# Patient Record
Sex: Female | Born: 1960 | Race: White | Hispanic: No | Marital: Single | Smoking: Former smoker
Health system: Southern US, Community
[De-identification: ages and names within clinical notes are randomized; demographics above are authoritative.]

## PROBLEM LIST (undated history)

## (undated) DIAGNOSIS — C50919 Malignant neoplasm of unspecified site of unspecified female breast: Secondary | ICD-10-CM

## (undated) DIAGNOSIS — C801 Malignant (primary) neoplasm, unspecified: Secondary | ICD-10-CM

## (undated) DIAGNOSIS — K219 Gastro-esophageal reflux disease without esophagitis: Secondary | ICD-10-CM

## (undated) DIAGNOSIS — M1990 Unspecified osteoarthritis, unspecified site: Secondary | ICD-10-CM

## (undated) DIAGNOSIS — K649 Unspecified hemorrhoids: Secondary | ICD-10-CM

## (undated) DIAGNOSIS — K5792 Diverticulitis of intestine, part unspecified, without perforation or abscess without bleeding: Secondary | ICD-10-CM

## (undated) DIAGNOSIS — Z9889 Other specified postprocedural states: Secondary | ICD-10-CM

## (undated) HISTORY — PX: BREAST LUMPECTOMY: SHX2

## (undated) HISTORY — DX: Malignant (primary) neoplasm, unspecified: C80.1

## (undated) HISTORY — PX: HERNIA REPAIR: SHX51

## (undated) HISTORY — DX: Other specified postprocedural states: Z98.890

## (undated) HISTORY — PX: CYST EXCISION: SHX5701

## (undated) HISTORY — PX: CHOLECYSTECTOMY: SHX55

## (undated) HISTORY — DX: Diverticulitis of intestine, part unspecified, without perforation or abscess without bleeding: K57.92

## (undated) HISTORY — DX: Unspecified osteoarthritis, unspecified site: M19.90

## (undated) HISTORY — DX: Gastro-esophageal reflux disease without esophagitis: K21.9

## (undated) HISTORY — DX: Malignant neoplasm of unspecified site of unspecified female breast: C50.919

## (undated) NOTE — Progress Notes (Signed)
 Formatting of this note is different from the original. Subjective  Patient ID: Haley Santana is a 13 y.o. female presenting to the Urgent Care with a chief complaint of UTI.  UTI  Patient with lower pelvic pressure and urgency to urinate. Symptoms have been ongoing but she could not sleep last night due to pain. No blood in urine. No flank pain. She feels like something is stretching her uterus. No fever, nausea, vomiting. No dysuria. She has not tried anything  Review of Systems  Objective  BP 126/78   Pulse 92   Temp 36.1 C (96.9 F) (Temporal)   Resp 16   Ht 1.638 m (5' 4.5)   Wt 77.1 kg (170 lb)   SpO2 97%   BMI 28.73 kg/m  No results found. No LMP recorded.  Physical Exam Vitals and nursing note reviewed.  Constitutional:      Appearance: Normal appearance. She is not ill-appearing.  HENT:     Head: Normocephalic and atraumatic.  Cardiovascular:     Rate and Rhythm: Normal rate and regular rhythm.  Pulmonary:     Effort: Pulmonary effort is normal. No respiratory distress.     Breath sounds: Normal breath sounds.  Abdominal:     Tenderness: There is no abdominal tenderness. There is no right CVA tenderness, left CVA tenderness, guarding or rebound.  Musculoskeletal:        General: Normal range of motion.  Skin:    General: Skin is warm and dry.  Neurological:     General: No focal deficit present.     Mental Status: She is alert.     Gait: Gait normal.  Psychiatric:        Mood and Affect: Mood normal.     In-House Lab Results:   Results for orders placed or performed in visit on 12/29/23  POCT Urinalysis   Collection Time: 12/29/23  8:26 AM  Result Value Ref Range   Color, UA Light Yellow    Clarity, UA Clear    Leukocytes, UA Negative    Nitrite, UA Negative    Urobilinogen, UA 0.2    Protein, UA Negative    pH, UA 6.0    Blood UA Negative    Spec Grav, UA 1.000    Ketones, UA Negative    Bilirubin, UA Negative    Glucose, UA Negative      Assessment & Plan   Assessment & Plan Urinary frequency  Orders:   POCT Urinalysis   phenazopyridine (Pyridium) 200 MG tablet; Take 1 tablet by mouth 3 times a day with meals for 2 days.   UA is negative for infection. No CVA tenderness or abdominal pain on exa,. Could be interstitial cystitic of function problem. Start pyridium and follow up with PCP for ultrasound  There are no Patient Instructions on file for this visit.  In-House Imaging Reads:    Procedure Documentation: Procedures   Alan Jenkins Rave, PA-C   Electronically signed by Alan Jenkins Rave, PA-C at 12/29/2023  8:51 AM EDT

---

## 1982-06-13 DIAGNOSIS — Z9889 Other specified postprocedural states: Secondary | ICD-10-CM

## 1982-06-13 HISTORY — DX: Other specified postprocedural states: Z98.890

## 2016-06-13 DIAGNOSIS — Z923 Personal history of irradiation: Secondary | ICD-10-CM

## 2016-06-13 HISTORY — PX: BREAST LUMPECTOMY: SHX2

## 2016-06-13 HISTORY — DX: Personal history of irradiation: Z92.3

## 2017-02-20 DIAGNOSIS — C50919 Malignant neoplasm of unspecified site of unspecified female breast: Secondary | ICD-10-CM

## 2017-02-20 HISTORY — DX: Malignant neoplasm of unspecified site of unspecified female breast: C50.919

## 2018-06-25 DIAGNOSIS — C50919 Malignant neoplasm of unspecified site of unspecified female breast: Secondary | ICD-10-CM | POA: Diagnosis not present

## 2018-06-25 DIAGNOSIS — Z6829 Body mass index (BMI) 29.0-29.9, adult: Secondary | ICD-10-CM | POA: Diagnosis not present

## 2018-06-29 DIAGNOSIS — Z13220 Encounter for screening for lipoid disorders: Secondary | ICD-10-CM | POA: Diagnosis not present

## 2018-06-29 DIAGNOSIS — Z1329 Encounter for screening for other suspected endocrine disorder: Secondary | ICD-10-CM | POA: Diagnosis not present

## 2018-06-29 DIAGNOSIS — Z114 Encounter for screening for human immunodeficiency virus [HIV]: Secondary | ICD-10-CM | POA: Diagnosis not present

## 2018-06-29 DIAGNOSIS — Z0000 Encounter for general adult medical examination without abnormal findings: Secondary | ICD-10-CM | POA: Diagnosis not present

## 2018-07-03 ENCOUNTER — Other Ambulatory Visit: Payer: Self-pay | Admitting: Family Medicine

## 2018-07-03 DIAGNOSIS — Z23 Encounter for immunization: Secondary | ICD-10-CM | POA: Diagnosis not present

## 2018-07-03 DIAGNOSIS — Z1151 Encounter for screening for human papillomavirus (HPV): Secondary | ICD-10-CM | POA: Diagnosis not present

## 2018-07-03 DIAGNOSIS — Z118 Encounter for screening for other infectious and parasitic diseases: Secondary | ICD-10-CM | POA: Diagnosis not present

## 2018-07-03 DIAGNOSIS — Z0000 Encounter for general adult medical examination without abnormal findings: Secondary | ICD-10-CM | POA: Diagnosis not present

## 2018-07-03 DIAGNOSIS — N6320 Unspecified lump in the left breast, unspecified quadrant: Secondary | ICD-10-CM | POA: Diagnosis not present

## 2018-07-03 DIAGNOSIS — Z6829 Body mass index (BMI) 29.0-29.9, adult: Secondary | ICD-10-CM | POA: Diagnosis not present

## 2018-07-03 DIAGNOSIS — N760 Acute vaginitis: Secondary | ICD-10-CM | POA: Diagnosis not present

## 2018-07-03 DIAGNOSIS — Z1211 Encounter for screening for malignant neoplasm of colon: Secondary | ICD-10-CM | POA: Diagnosis not present

## 2018-07-03 DIAGNOSIS — Z113 Encounter for screening for infections with a predominantly sexual mode of transmission: Secondary | ICD-10-CM | POA: Diagnosis not present

## 2018-07-03 DIAGNOSIS — Z01419 Encounter for gynecological examination (general) (routine) without abnormal findings: Secondary | ICD-10-CM | POA: Diagnosis not present

## 2018-07-05 ENCOUNTER — Other Ambulatory Visit: Payer: Self-pay | Admitting: Family Medicine

## 2018-07-05 DIAGNOSIS — N6320 Unspecified lump in the left breast, unspecified quadrant: Secondary | ICD-10-CM

## 2018-07-24 ENCOUNTER — Ambulatory Visit: Payer: BLUE CROSS/BLUE SHIELD | Admitting: Orthopaedic Surgery

## 2018-07-24 ENCOUNTER — Encounter: Payer: Self-pay | Admitting: Orthopaedic Surgery

## 2018-07-24 ENCOUNTER — Ambulatory Visit (INDEPENDENT_AMBULATORY_CARE_PROVIDER_SITE_OTHER): Payer: BLUE CROSS/BLUE SHIELD

## 2018-07-24 VITALS — BP 123/81 | HR 87 | Ht 64.0 in | Wt 179.0 lb

## 2018-07-24 DIAGNOSIS — M25571 Pain in right ankle and joints of right foot: Secondary | ICD-10-CM | POA: Diagnosis not present

## 2018-07-24 NOTE — Progress Notes (Signed)
Subjective:    Patient ID: Haley Santana, female    DOB: March 01, 1961, 58 y.o.   MRN: [id number]  HPI She had an injury to the right foot about five years ago. It has been bothering her on and off since then. She has had some swelling in the past but none now. She has no redness.  She has no new trauma.  She has pain over the forefoot more in the metatarsal head area. She has no change in shoe wear. She has tried Advil which helps, Aspercreme which helped. She has tried stretching. She has no numbness.   Review of Systems  Constitutional: Positive for activity change.  Musculoskeletal: Positive for arthralgias and gait problem.  All other systems reviewed and are negative.  For Review of Systems, all other systems reviewed and are negative.  The following is a summary of the past history medically, past history surgically, known current medicines, social history and family history.  This information is gathered electronically by the computer from prior information and documentation.  I review this each visit and have found including this information at this point in the chart is beneficial and informative.   Past Medical History:  Diagnosis Date  . Arthritis   . Cancer ([place])   . GERD (gastroesophageal reflux disease)     Past Surgical History:  Procedure Laterality Date  . BREAST LUMPECTOMY    . CHOLECYSTECTOMY    . CYST EXCISION    . HERNIA REPAIR      Current Outpatient Medications on File Prior to Visit  Medication Sig Dispense Refill  . cetirizine (ZYRTEC) 10 MG tablet Take 10 mg by mouth daily.    . tamoxifen (NOLVADEX) 20 MG tablet      No current facility-administered medications on file prior to visit.     Social History   Socioeconomic History  . Marital status: Single    Spouse name: Not on file  . Number of children: Not on file  . Years of education: Not on file  . Highest education level: Not on file  Occupational History  . Not on file  Social Needs    . Financial resource strain: Not on file  . Food insecurity:    Worry: Not on file    Inability: Not on file  . Transportation needs:    Medical: Not on file    Non-medical: Not on file  Tobacco Use  . Smoking status: Never Smoker  . Smokeless tobacco: Never Used  Substance and Sexual Activity  . Alcohol use: Yes  . Drug use: Never  . Sexual activity: Not on file  Lifestyle  . Physical activity:    Days per week: Not on file    Minutes per session: Not on file  . Stress: Not on file  Relationships  . Social connections:    Talks on phone: Not on file    Gets together: Not on file    Attends religious service: Not on file    Active member of club or organization: Not on file    Attends meetings of clubs or organizations: Not on file    Relationship status: Not on file  . Intimate partner violence:    Fear of current or ex partner: Not on file    Emotionally abused: Not on file    Physically abused: Not on file    Forced sexual activity: Not on file  Other Topics Concern  . Not on file  Social History Narrative  .  Not on file    Family History  Problem Relation Age of Onset  . Cancer Mother   . Cancer Father   . Cancer Sister   . Diabetes Brother   . Cancer Brother     BP 123/81   Pulse 87   Ht 5\' 4"  (1.626 m)   Wt 179 lb (81.2 kg)   BMI 30.73 kg/m   Body mass index is 30.73 kg/m.     Objective:   Physical Exam Constitutional:      Appearance: She is well-developed.  HENT:     Head: Normocephalic and atraumatic.  Eyes:     Conjunctiva/sclera: Conjunctivae normal.     Pupils: Pupils are equal, round, and reactive to light.  Neck:     Musculoskeletal: Normal range of motion and neck supple.  Cardiovascular:     Rate and Rhythm: Normal rate and regular rhythm.  Pulmonary:     Effort: Pulmonary effort is normal.  Abdominal:     Palpations: Abdomen is soft.  Musculoskeletal:       Feet:  Skin:    General: Skin is warm and dry.  Neurological:      Mental Status: She is alert and oriented to person, place, and time.     Cranial Nerves: No cranial nerve deficit.     Motor: No abnormal muscle tone.     Coordination: Coordination normal.     Deep Tendon Reflexes: Reflexes are normal and symmetric. Reflexes normal.  Psychiatric:        Behavior: Behavior normal.        Thought Content: Thought content normal.        Judgment: Judgment normal.      X-rays were done of the right foot, reported separately.  Negative.     Assessment & Plan:   Encounter Diagnosis  Name Primary?  . Pain in joint involving right ankle and foot Yes   I have recommended Advil as needed.  I have recommended stretching of the toes bid and exercises.  Return in one month.  Call if any problem.  Precautions discussed.   Electronically Signed Sanjuana Kava, MD 2/11/202011:05 AM

## 2018-08-03 ENCOUNTER — Encounter (HOSPITAL_COMMUNITY): Payer: Self-pay | Admitting: Hematology

## 2018-08-03 ENCOUNTER — Inpatient Hospital Stay (HOSPITAL_COMMUNITY): Payer: BLUE CROSS/BLUE SHIELD | Attending: Hematology | Admitting: Hematology

## 2018-08-03 VITALS — BP 138/74 | HR 93 | Temp 98.1°F | Resp 18 | Wt 176.0 lb

## 2018-08-03 DIAGNOSIS — Z79810 Long term (current) use of selective estrogen receptor modulators (SERMs): Secondary | ICD-10-CM | POA: Diagnosis not present

## 2018-08-03 DIAGNOSIS — C50412 Malignant neoplasm of upper-outer quadrant of left female breast: Secondary | ICD-10-CM | POA: Diagnosis not present

## 2018-08-03 DIAGNOSIS — Z170 Estrogen receptor positive status [ER+]: Secondary | ICD-10-CM | POA: Diagnosis not present

## 2018-08-03 DIAGNOSIS — Z13820 Encounter for screening for osteoporosis: Secondary | ICD-10-CM

## 2018-08-03 DIAGNOSIS — Z923 Personal history of irradiation: Secondary | ICD-10-CM

## 2018-08-03 NOTE — Assessment & Plan Note (Addendum)
1.  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 - Partial breast radiation therapy dose of 34 Gray at 3.4 Gy per fraction given twice daily 6 hours apart in 10 fractions completed on 03/24/2017 at [facility]. - Tamoxifen started in October 2018.  She has very minor hot flashes at nighttime which have improved since the beginning. - Myriad myRisk-heterozygosity for MUTYH with a slightly increased risk for colon cancer. -Bilateral mammogram in July 2019 done at [facility] reportedly negative. -Colonoscopy in 2019 was reportedly negative. -We will obtain mammogram reports.  We will schedule for mammogram end of July.  I have also recommended doing a bone density test. -Physical examination today did not reveal any palpable masses.  Left breast lumpectomy scar in the periareolar region is stable.  There is mild lymphedema in the left breast.  Left axillary node dissection scar is also stable.  No palpable adenopathy. -We have counseled her to exercise on a regular basis to decrease her chances of cancer coming back. - I will see her back after the mammogram and bone density test.

## 2018-08-03 NOTE — Progress Notes (Signed)
[facility] NOTE  Patient Care Team: Fanny Bien, MD as PCP - General (Family Medicine)  CHIEF COMPLAINTS/PURPOSE OF CONSULTATION: Left breast cancer in 2018 establishing care from [place]:  Haley Santana 58 y.o. female is here because of a diagnosis of left breast cancer in 2018. In August of 2018 she had her routine mammogram where they found a left breast mass. They brought her in for a ultrasound the very next day. That next week on 01/25/2017 she had a biopsy of the mass. She was diagnosed with invasive ductal carcinoma of the left breast, ER/PR+ HER2-. Surgery was set up and done on 02/02/2017. They waited approximately 4 weeks and did 10 days of radiation. Tamoxifen was started 3 weeks after radiation was completed. She was having severe hot flashes when she first started the tamoxifen and they have improved over time. She still has them however they are only at night time. Denies any nausea, vomiting, or diarrhea. Denies any new pains. Had not noticed any recent bleeding such as epistaxis, hematuria or hematochezia. Denies recent chest pain on exertion, shortness of breath on minimal exertion, pre-syncopal episodes, or palpitations. Denies any numbness or tingling in hands or feet. Denies any recent fevers, infections, or recent hospitalizations. Patient reports appetite at 100% and energy level at 75%.  She reports she has never had a Dexa scan. She had a Colonoscopy at the beginning of 2019 and reports that it was completely normal. She had her last Mammogram bilateral diagnostic in the middle of July 2019. She reports she does not take calcium of Vitamin D however she will start taking these.   She has a large family history of cancer. Her mother had lung cancer and was a heavy smoker. Her father had colon cancer. She had two brothers with skin cancer. One sister with tongue cancer. Maternal aunt and maternal cousin with breast cancer.  One maternal aunt with Ovarian cancer.   She moved from [place] in July 2019 due to family living in [place]. She is retired at this time but plans on finding something part time to help her financially. 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 She lives alone with her 2 cats and performs all her own ADLs and activities. She drives and handles all her own finances.   I reviewed her records extensively and collaborated the history with the patient.  SUMMARY OF ONCOLOGIC HISTORY:   Breast cancer of upper-outer quadrant of left female breast ([place])   08/03/2018 Initial Diagnosis    Breast cancer of upper-outer quadrant of left female breast ([place])    08/03/2018 Cancer Staging    Staging form: Breast, AJCC 8th Edition - Clinical stage from 08/03/2018: Stage IA (cT1a, cN0, cM0, G1, ER+, PR+, HER2-) - Signed by Derek Jack, MD on 08/03/2018     In terms of breast cancer risk profile:  She menarched at early age of 49 and went to menopause at age 62 She had 4 pregnancies and two of those she had miscarriages.  She did received birth control pills for approximately 2 years. She was never exposed to fertility medications or hormone replacement therapy.  She has a positive family history of Breast/GYN/GI cancer  MEDICAL HISTORY:  Past Medical History:  Diagnosis Date  . Arthritis   . Breast cancer ([place]) 02/20/2017   left  . Cancer ([place])   . Diverticulitis   .  GERD (gastroesophageal reflux disease)   . H/O benign breast biopsy 1984   right    SURGICAL HISTORY: Past Surgical History:  Procedure Laterality Date  . BREAST LUMPECTOMY  02/02/2017  . CHOLECYSTECTOMY  10/2016  . CYST EXCISION    . HERNIA REPAIR      SOCIAL HISTORY: Social History   Socioeconomic History  . Marital status: Single    Spouse name: Not on file  . Number of children: Not on file  . Years of education: Not on file  . Highest  education level: Not on file  Occupational History  . Not on file  Social Needs  . Financial resource strain: Not on file  . Food insecurity:    Worry: Not on file    Inability: Not on file  . Transportation needs:    Medical: Not on file    Non-medical: Not on file  Tobacco Use  . Smoking status: Never Smoker  . Smokeless tobacco: Never Used  Substance and Sexual Activity  . Alcohol use: Yes  . Drug use: Never  . Sexual activity: Not on file  Lifestyle  . Physical activity:    Days per week: Not on file    Minutes per session: Not on file  . Stress: Not on file  Relationships  . Social connections:    Talks on phone: Not on file    Gets together: Not on file    Attends religious service: Not on file    Active member of club or organization: Not on file    Attends meetings of clubs or organizations: Not on file    Relationship status: Not on file  . Intimate partner violence:    Fear of current or ex partner: Not on file    Emotionally abused: Not on file    Physically abused: Not on file    Forced sexual activity: Not on file  Other Topics Concern  . Not on file  Social History Narrative  . Not on file    FAMILY HISTORY: Family History  Problem Relation Age of Onset  . Cancer Mother   . Cancer Father   . Cancer Sister   . Diabetes Brother   . Cancer Brother     ALLERGIES:  is allergic to dust mite extract; latex; and other.  MEDICATIONS:  Current Outpatient Medications  Medication Sig Dispense Refill  . cetirizine (ZYRTEC) 10 MG tablet Take 10 mg by mouth daily.    . Multiple Vitamins-Minerals (MULTIVITAMIN WITH MINERALS) tablet Take 1 tablet by mouth daily.    . tamoxifen (NOLVADEX) 20 MG tablet      No current facility-administered medications for this visit.     REVIEW OF SYSTEMS:   Constitutional: Denies fevers, chills or abnormal night sweats Eyes: Denies blurriness of vision, double vision or watery eyes Ears, nose, mouth, throat, and face:  Denies mucositis or sore throat Respiratory: Denies cough, dyspnea or wheezes Cardiovascular: Denies palpitation, chest discomfort or lower extremity swelling Gastrointestinal:  Denies nausea, heartburn or change in bowel habits Skin: Denies abnormal skin rashes Lymphatics: Denies new lymphadenopathy or easy bruising Neurological:Denies numbness, tingling or new weaknesses Behavioral/Psych: Mood is stable, no new changes  Breast: Denies any palpable lumps or discharge All other systems were reviewed with the patient and are negative.  PHYSICAL EXAMINATION: ECOG PERFORMANCE STATUS: 1 - Symptomatic but completely ambulatory  Vitals:   08/03/18 1307  BP: 138/74  Pulse: 93  Resp: 18  Temp: 98.1 F (36.7 C)  SpO2: 98%   Filed Weights   08/03/18 1307  Weight: 176 lb (79.8 kg)    GENERAL:alert, no distress and comfortable SKIN: skin color, texture, turgor are normal, no rashes or significant lesions EYES: normal, conjunctiva are pink and non-injected, sclera clear OROPHARYNX:no exudate, no erythema and lips, buccal mucosa, and tongue normal  NECK: supple, thyroid normal size, non-tender, without nodularity LYMPH:  no palpable lymphadenopathy in the cervical, axillary or inguinal LUNGS: clear to auscultation and percussion with normal breathing effort HEART: regular rate & rhythm and no murmurs and no lower extremity edema ABDOMEN:abdomen soft, non-tender and normal bowel sounds Musculoskeletal:no cyanosis of digits and no clubbing  PSYCH: alert & oriented x 3 with fluent speech NEURO: no focal motor/sensory deficits BREAST:No palpable nodules in breast. No palpable axillary or supraclavicular lymphadenopathy (exam performed in the presence of a chaperone)   LABORATORY DATA:  I have reviewed the data as listed  RADIOGRAPHIC STUDIES: I have personally reviewed the radiological reports and agreed with the findings in the report. I have reviewed Francene Finders, NP's note and agree  with the documentation.  I personally performed a face-to-face visit, made revisions and my assessment and plan is as follows.  ASSESSMENT AND PLAN:  Breast cancer of upper-outer quadrant of left female breast ([place]) 1.  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 - Partial breast radiation therapy dose of 34 Gray at 3.4 Gy per fraction given twice daily 6 hours apart in 10 fractions completed on 03/24/2017 at [facility]. - Tamoxifen started in October 2018.  She has very minor hot flashes at nighttime which have improved since the beginning. - Myriad myRisk-heterozygosity for MUTYH with a slightly increased risk for colon cancer. -Bilateral mammogram in July 2019 done at [facility] reportedly negative. -Colonoscopy in 2019 was reportedly negative. -We will obtain mammogram reports.  We will schedule for mammogram end of July.  I have also recommended doing a bone density test. -Physical examination today did not reveal any palpable masses.  Left breast lumpectomy scar in the periareolar region is stable.  There is mild lymphedema in the left breast.  Left axillary node dissection scar is also stable.  No palpable adenopathy. -We have counseled her to exercise on a regular basis to decrease her chances of cancer coming back. - I will see her back after the mammogram and bone density test.   All questions were answered. The patient knows to call the clinic with any problems, questions or concerns.    Derek Jack, MD 08/03/18

## 2018-08-03 NOTE — Patient Instructions (Signed)
[place] at [facility] Discharge Instructions  Please start taking calcium and Vit D daily. Follow up with [place] in 5 months after your dexa bone scan and mammogram    Thank you for choosing [place] at [facility] to provide your oncology and hematology care.  To afford each patient quality time with our provider, please arrive at least 15 minutes before your scheduled appointment time.   If you have a lab appointment with the [facility] please come in thru the  Main Entrance and check in at the main information desk  You need to re-schedule your appointment should you arrive 10 or more minutes late.  We strive to give you quality time with our providers, and arriving late affects you and other patients whose appointments are after yours.  Also, if you no show three or more times for appointments you may be dismissed from the clinic at the providers discretion.     Again, thank you for choosing [facility].  Our hope is that these requests will decrease the amount of time that you wait before being seen by our physicians.       _____________________________________________________________  Should you have questions after your visit to [facility], please contact our office at [phone] between the hours of 8:00 a.m. and 4:30 p.m.  Voicemails left after 4:00 p.m. will not be returned until the following business day.  For prescription refill requests, have your pharmacy contact our office and allow 72 hours.    Cancer Center Support Programs:   > Cancer Support Group  2nd Tuesday of the month 1pm-2pm, Journey Room

## 2018-08-07 DIAGNOSIS — C50919 Malignant neoplasm of unspecified site of unspecified female breast: Secondary | ICD-10-CM | POA: Diagnosis not present

## 2018-08-07 DIAGNOSIS — Z6830 Body mass index (BMI) 30.0-30.9, adult: Secondary | ICD-10-CM | POA: Diagnosis not present

## 2018-08-09 ENCOUNTER — Other Ambulatory Visit: Payer: Self-pay | Admitting: Family Medicine

## 2018-08-09 DIAGNOSIS — R52 Pain, unspecified: Secondary | ICD-10-CM

## 2018-08-10 DIAGNOSIS — Z23 Encounter for immunization: Secondary | ICD-10-CM | POA: Diagnosis not present

## 2018-08-16 ENCOUNTER — Ambulatory Visit
Admission: RE | Admit: 2018-08-16 | Discharge: 2018-08-16 | Disposition: A | Discharge status: Home / Self Care | Payer: BLUE CROSS/BLUE SHIELD | Source: Ambulatory Visit | Attending: Family Medicine | Admitting: Family Medicine

## 2018-08-16 DIAGNOSIS — R102 Pelvic and perineal pain: Secondary | ICD-10-CM | POA: Diagnosis not present

## 2018-08-16 DIAGNOSIS — R52 Pain, unspecified: Secondary | ICD-10-CM

## 2018-09-11 ENCOUNTER — Ambulatory Visit: Payer: BLUE CROSS/BLUE SHIELD | Admitting: Orthopaedic Surgery

## 2018-12-12 ENCOUNTER — Ambulatory Visit (HOSPITAL_COMMUNITY): Payer: Self-pay | Admitting: Nurse Practitioner

## 2019-01-01 ENCOUNTER — Other Ambulatory Visit (HOSPITAL_COMMUNITY): Payer: Self-pay | Admitting: Nurse Practitioner

## 2019-01-01 DIAGNOSIS — C50412 Malignant neoplasm of upper-outer quadrant of left female breast: Secondary | ICD-10-CM

## 2019-01-01 DIAGNOSIS — Z170 Estrogen receptor positive status [ER+]: Secondary | ICD-10-CM

## 2019-01-02 ENCOUNTER — Other Ambulatory Visit (HOSPITAL_COMMUNITY): Payer: Self-pay | Admitting: Family Medicine

## 2019-01-08 ENCOUNTER — Ambulatory Visit (HOSPITAL_COMMUNITY): Admission: RE | Admit: 2019-01-08 | Payer: BC Managed Care – PPO | Source: Ambulatory Visit

## 2019-01-08 ENCOUNTER — Other Ambulatory Visit: Payer: Self-pay

## 2019-01-08 ENCOUNTER — Ambulatory Visit (HOSPITAL_COMMUNITY)
Admission: RE | Admit: 2019-01-08 | Discharge: 2019-01-08 | Disposition: A | Discharge status: Home / Self Care | Payer: BC Managed Care – PPO | Source: Ambulatory Visit | Attending: Nurse Practitioner | Admitting: Nurse Practitioner

## 2019-01-08 ENCOUNTER — Inpatient Hospital Stay (HOSPITAL_COMMUNITY): Payer: BC Managed Care – PPO | Attending: Hematology

## 2019-01-08 DIAGNOSIS — Z13820 Encounter for screening for osteoporosis: Secondary | ICD-10-CM | POA: Insufficient documentation

## 2019-01-08 DIAGNOSIS — Z780 Asymptomatic menopausal state: Secondary | ICD-10-CM | POA: Diagnosis not present

## 2019-01-08 DIAGNOSIS — C50412 Malignant neoplasm of upper-outer quadrant of left female breast: Secondary | ICD-10-CM | POA: Diagnosis not present

## 2019-01-08 DIAGNOSIS — Z170 Estrogen receptor positive status [ER+]: Secondary | ICD-10-CM

## 2019-01-08 LAB — COMPREHENSIVE METABOLIC PANEL
ALT: 26 U/L (ref 0–44)
AST: 22 U/L (ref 15–41)
Albumin: 4 g/dL (ref 3.5–5.0)
Alkaline Phosphatase: 47 U/L (ref 38–126)
Anion gap: 7 (ref 5–15)
BUN: 18 mg/dL (ref 6–20)
CO2: 26 mmol/L (ref 22–32)
Calcium: 9.1 mg/dL (ref 8.9–10.3)
Chloride: 108 mmol/L (ref 98–111)
Creatinine, Ser: 0.88 mg/dL (ref 0.44–1.00)
GFR calc Af Amer: >60 mL/min (ref >60)
GFR calc non Af Amer: >60 mL/min (ref >60)
Glucose, Bld: 88 mg/dL (ref 70–99)
Potassium: 4.4 mmol/L (ref 3.5–5.1)
Sodium: 141 mmol/L (ref 135–145)
Total Bilirubin: 0.5 mg/dL (ref 0.3–1.2)
Total Protein: 7 g/dL (ref 6.5–8.1)

## 2019-01-08 LAB — CBC WITH DIFFERENTIAL/PLATELET
Abs Immature Granulocytes: 0.02 10*3/uL (ref 0.00–0.07)
Basophils Absolute: 0.1 10*3/uL (ref 0.0–0.1)
Basophils Relative: 1 %
Eosinophils Absolute: 0.2 10*3/uL (ref 0.0–0.5)
Eosinophils Relative: 3 %
HCT: 39.6 % (ref 36.0–46.0)
Hemoglobin: 12.7 g/dL (ref 12.0–15.0)
Immature Granulocytes: 0 %
Lymphocytes Relative: 37 %
Lymphs Abs: 1.9 10*3/uL (ref 0.7–4.0)
MCH: 31 pg (ref 26.0–34.0)
MCHC: 32.1 g/dL (ref 30.0–36.0)
MCV: 96.6 fL (ref 80.0–100.0)
Monocytes Absolute: 0.6 10*3/uL (ref 0.1–1.0)
Monocytes Relative: 12 %
Neutro Abs: 2.4 10*3/uL (ref 1.7–7.7)
Neutrophils Relative %: 47 %
Platelets: 213 10*3/uL (ref 150–400)
RBC: 4.1 MIL/uL (ref 3.87–5.11)
RDW: 12.8 % (ref 11.5–15.5)
WBC: 5.2 10*3/uL (ref 4.0–10.5)
nRBC: 0 % (ref 0.0–0.2)

## 2019-01-08 LAB — LACTATE DEHYDROGENASE: LDH: 166 U/L (ref 98–192)

## 2019-01-14 ENCOUNTER — Ambulatory Visit (HOSPITAL_COMMUNITY): Payer: Self-pay | Admitting: Hematology

## 2019-01-15 ENCOUNTER — Other Ambulatory Visit (HOSPITAL_COMMUNITY): Payer: BC Managed Care – PPO

## 2019-01-15 ENCOUNTER — Ambulatory Visit (HOSPITAL_COMMUNITY): Admission: RE | Admit: 2019-01-15 | Payer: BC Managed Care – PPO | Source: Ambulatory Visit

## 2019-01-15 ENCOUNTER — Ambulatory Visit (HOSPITAL_COMMUNITY)
Admission: RE | Admit: 2019-01-15 | Discharge: 2019-01-15 | Disposition: A | Discharge status: Home / Self Care | Payer: BC Managed Care – PPO | Source: Ambulatory Visit | Attending: Nurse Practitioner | Admitting: Nurse Practitioner

## 2019-01-15 ENCOUNTER — Other Ambulatory Visit: Payer: Self-pay

## 2019-01-15 ENCOUNTER — Ambulatory Visit (HOSPITAL_COMMUNITY): Payer: BC Managed Care – PPO

## 2019-01-15 DIAGNOSIS — C50412 Malignant neoplasm of upper-outer quadrant of left female breast: Secondary | ICD-10-CM | POA: Diagnosis not present

## 2019-01-15 DIAGNOSIS — Z170 Estrogen receptor positive status [ER+]: Secondary | ICD-10-CM | POA: Diagnosis not present

## 2019-01-15 DIAGNOSIS — R928 Other abnormal and inconclusive findings on diagnostic imaging of breast: Secondary | ICD-10-CM | POA: Diagnosis not present

## 2019-04-15 ENCOUNTER — Other Ambulatory Visit (HOSPITAL_COMMUNITY): Payer: Self-pay | Admitting: Hematology

## 2019-04-15 ENCOUNTER — Encounter (HOSPITAL_COMMUNITY): Payer: Self-pay | Admitting: Hematology

## 2019-04-15 ENCOUNTER — Other Ambulatory Visit: Payer: Self-pay

## 2019-04-15 ENCOUNTER — Inpatient Hospital Stay (HOSPITAL_COMMUNITY): Payer: BC Managed Care – PPO | Attending: Hematology | Admitting: Hematology

## 2019-04-15 VITALS — BP 134/73 | HR 82 | Temp 98.7°F | Resp 18 | Wt 173.9 lb

## 2019-04-15 DIAGNOSIS — Z170 Estrogen receptor positive status [ER+]: Secondary | ICD-10-CM

## 2019-04-15 DIAGNOSIS — Z79810 Long term (current) use of selective estrogen receptor modulators (SERMs): Secondary | ICD-10-CM | POA: Insufficient documentation

## 2019-04-15 DIAGNOSIS — C50412 Malignant neoplasm of upper-outer quadrant of left female breast: Secondary | ICD-10-CM

## 2019-04-15 NOTE — Progress Notes (Signed)
[place] [place], [place] [address]   CLINIC:  Medical Oncology/Hematology  PCP:  Fanny Bien, [address] [place] [address] [phone]   REASON FOR VISIT:  Follow-up for Breast Cancer   CURRENT THERAPY: Tamoxifen  BRIEF ONCOLOGIC HISTORY:  Oncology History  Breast cancer of upper-outer quadrant of left female breast ([place])  08/03/2018 Initial Diagnosis   Breast cancer of upper-outer quadrant of left female breast ([place])   08/03/2018 Cancer Staging   Staging form: Breast, AJCC 8th Edition - Clinical stage from 08/03/2018: Stage IA (cT1a, cN0, cM0, G1, ER+, PR+, HER2-) - Signed by Derek Jack, MD on 08/03/2018      CANCER STAGING: Cancer Staging Breast cancer of upper-outer quadrant of left female breast [facility]) Staging form: Breast, AJCC 8th Edition - Clinical stage from 08/03/2018: Stage IA (cT1a, cN0, cM0, G1, ER+, PR+, HER2-) - Signed by Derek Jack, MD on 08/03/2018    INTERVAL HISTORY:  [name] 58 y.o. female presents today for follow-up.  She reports overall doing well.  She denies any significant fatigue.  She denies any changes in her breast.  No new lumps, masses, nipple inversion or discharge.  She continues on tamoxifen, tolerating well.  The main side effect she experiences is hot flashes which are tolerable.  She is here for repeat labs and follow-up.   REVIEW OF SYSTEMS:  Review of Systems  All other systems reviewed and are negative.    PAST MEDICAL/SURGICAL HISTORY:  Past Medical History:  Diagnosis Date  . Arthritis   . Breast cancer ([place] [facility]) 02/20/2017   left  . Cancer ([place])   . Diverticulitis   . GERD (gastroesophageal reflux disease)   . H/O benign breast biopsy 1984   right   Past Surgical History:  Procedure Laterality Date  . BREAST LUMPECTOMY    . CHOLECYSTECTOMY    . CYST EXCISION    . HERNIA REPAIR       SOCIAL HISTORY:  Social History   Socioeconomic  History  . Marital status: Single    Spouse name: Not on file  . Number of children: Not on file  . Years of education: Not on file  . Highest education level: Not on file  Occupational History  . Not on file  Social Needs  . Financial resource strain: Not on file  . Food insecurity    Worry: Not on file    Inability: Not on file  . Transportation needs    Medical: Not on file    Non-medical: Not on file  Tobacco Use  . Smoking status: Never Smoker  . Smokeless tobacco: Never Used  Substance and Sexual Activity  . Alcohol use: Yes  . Drug use: Never  . Sexual activity: Not on file  Lifestyle  . Physical activity    Days per week: Not on file    Minutes per session: Not on file  . Stress: Not on file  Relationships  . Social Herbalist on phone: Not on file    Gets together: Not on file    Attends religious service: Not on file    Active member of club or organization: Not on file    Attends meetings of clubs or organizations: Not on file    Relationship status: Not on file  . Intimate partner violence    Fear of current or ex partner: Not on file    Emotionally abused: Not  on file    Physically abused: Not on file    Forced sexual activity: Not on file  Other Topics Concern  . Not on file  Social History Narrative  . Not on file    FAMILY HISTORY:  Family History  Problem Relation Age of Onset  . Cancer Mother   . Cancer Father   . Cancer Sister   . Diabetes Brother   . Cancer Brother     CURRENT MEDICATIONS:  Outpatient Encounter Medications as of 04/15/2019  Medication Sig  . Multiple Vitamins-Minerals (MULTIVITAMIN WITH MINERALS) tablet Take 1 tablet by mouth daily.  . tamoxifen (NOLVADEX) 20 MG tablet   . cetirizine (ZYRTEC) 10 MG tablet Take 10 mg by mouth daily.   No facility-administered encounter medications on file as of 04/15/2019.     ALLERGIES:  Allergies  Allergen Reactions  . Dust Mite Extract   . Latex   . Other     Tree  nuts     PHYSICAL EXAM:  ECOG Performance status: 1  Vitals:   04/15/19 1359  BP: 134/73  Pulse: 82  Resp: 18  Temp: 98.7 F (37.1 C)  SpO2: 100%   Filed Weights   04/15/19 1359  Weight: 173 lb 14.4 oz (78.9 kg)    Physical Exam Constitutional:      Appearance: Normal appearance.  HENT:     Head: Normocephalic.     Right Ear: External ear normal.     Left Ear: External ear normal.     Nose: Nose normal.     Mouth/Throat:     Pharynx: Oropharynx is clear.  Eyes:     Conjunctiva/sclera: Conjunctivae normal.  Neck:     Musculoskeletal: Normal range of motion.  Cardiovascular:     Rate and Rhythm: Normal rate and regular rhythm.     Pulses: Normal pulses.     Heart sounds: Normal heart sounds.  Pulmonary:     Effort: Pulmonary effort is normal.     Breath sounds: Normal breath sounds.  Abdominal:     General: Bowel sounds are normal.  Musculoskeletal: Normal range of motion.  Skin:    General: Skin is warm.  Neurological:     General: No focal deficit present.     Mental Status: She is alert and oriented to person, place, and time. Mental status is at baseline.  Psychiatric:        Mood and Affect: Mood normal.        Behavior: Behavior normal.        Thought Content: Thought content normal.        Judgment: Judgment normal.      LABORATORY DATA:  I have reviewed the labs as listed.  CBC    Component Value Date/Time   WBC 5.2 01/08/2019 1050   RBC 4.10 01/08/2019 1050   HGB 12.7 01/08/2019 1050   HCT 39.6 01/08/2019 1050   PLT 213 01/08/2019 1050   MCV 96.6 01/08/2019 1050   MCH 31.0 01/08/2019 1050   MCHC 32.1 01/08/2019 1050   RDW 12.8 01/08/2019 1050   LYMPHSABS 1.9 01/08/2019 1050   MONOABS 0.6 01/08/2019 1050   EOSABS 0.2 01/08/2019 1050   BASOSABS 0.1 01/08/2019 1050   CMP Latest Ref Rng & Units 01/08/2019  Glucose 70 - 99 mg/dL 88  BUN 6 - 20 mg/dL 18  Creatinine 0.44 - 1.00 mg/dL 0.88  Sodium 135 - 145 mmol/L 141  Potassium 3.5 -  5.1 mmol/L 4.4  Chloride  98 - 111 mmol/L 108  CO2 22 - 32 mmol/L 26  Calcium 8.9 - 10.3 mg/dL 9.1  Total Protein 6.5 - 8.1 g/dL 7.0  Total Bilirubin 0.3 - 1.2 mg/dL 0.5  Alkaline Phos 38 - 126 U/L 47  AST 15 - 41 U/L 22  ALT 0 - 44 U/L 26        ASSESSMENT & PLAN:   Breast cancer of upper-outer quadrant of left female breast ([facility]) 1.  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 - Partial breast radiation therapy dose of 34 Gray at 3.4 Gy per fraction given twice daily 6 hours apart in 10 fractions completed on 03/24/2017 at [facility]. - Tamoxifen started in October 2018.  She has very minor hot flashes at nighttime which have improved since the beginning. - Myriad myRisk-heterozygosity for MUTYH with a slightly increased risk for colon cancer. -Bilateral mammogram in July 2019 done at [facility] reportedly negative. -Colonoscopy in 2019 was reportedly negative. -We will obtain mammogram reports.   -Physical examination today did not reveal any palpable masses.  Left breast lumpectomy scar in the periareolar region is stable.  There is mild lymphedema in the left breast.  Left axillary node dissection scar is also stable.  No palpable adenopathy. -Most recent mammogram is from August 2020 which was negative for any malignancy. -Recommend patient continue on tamoxifen. -Plan to repeat mammogram in August 2021. -Patient will return to clinic in 4 months or sooner if needed.  2.  Bone health -DEXA scan completed in July 2020 showed normal bone density. -Recommend patient continue on vitamin D and calcium.      Orders placed this encounter:  Orders Placed This Encounter  Procedures  . MM DIAG BREAST TOMO [place] [phone]

## 2019-04-15 NOTE — Assessment & Plan Note (Signed)
1.  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 - Partial breast radiation therapy dose of 34 Gray at 3.4 Gy per fraction given twice daily 6 hours apart in 10 fractions completed on 03/24/2017 at [facility]. - Tamoxifen started in October 2018.  She has very minor hot flashes at nighttime which have improved since the beginning. - Myriad myRisk-heterozygosity for MUTYH with a slightly increased risk for colon cancer. -Bilateral mammogram in July 2019 done at [facility] reportedly negative. -Colonoscopy in 2019 was reportedly negative. -We will obtain mammogram reports.   -Physical examination today did not reveal any palpable masses.  Left breast lumpectomy scar in the periareolar region is stable.  There is mild lymphedema in the left breast.  Left axillary node dissection scar is also stable.  No palpable adenopathy. -Most recent mammogram is from August 2020 which was negative for any malignancy. -Recommend patient continue on tamoxifen. -Plan to repeat mammogram in August 2021. -Patient will return to clinic in 4 months or sooner if needed.  2.  Bone health -DEXA scan completed in July 2020 showed normal bone density. -Recommend patient continue on vitamin D and calcium.

## 2019-04-22 ENCOUNTER — Other Ambulatory Visit (HOSPITAL_COMMUNITY): Payer: Self-pay | Admitting: *Deleted

## 2019-04-22 MED ORDER — TAMOXIFEN CITRATE 20 MG PO TABS: 20.0000 mg | ORAL_TABLET | Freq: Every day | ORAL | 3 refills | Status: DC

## 2019-05-07 DIAGNOSIS — Z20828 Contact with and (suspected) exposure to other viral communicable diseases: Secondary | ICD-10-CM | POA: Diagnosis not present

## 2019-05-07 DIAGNOSIS — J029 Acute pharyngitis, unspecified: Secondary | ICD-10-CM | POA: Diagnosis not present

## 2019-05-07 DIAGNOSIS — J309 Allergic rhinitis, unspecified: Secondary | ICD-10-CM | POA: Diagnosis not present

## 2019-10-15 ENCOUNTER — Other Ambulatory Visit (HOSPITAL_COMMUNITY): Payer: BC Managed Care – PPO

## 2019-10-22 ENCOUNTER — Ambulatory Visit (HOSPITAL_COMMUNITY): Payer: BC Managed Care – PPO | Admitting: Hematology

## 2019-10-29 DIAGNOSIS — Z853 Personal history of malignant neoplasm of breast: Secondary | ICD-10-CM | POA: Diagnosis not present

## 2019-10-29 DIAGNOSIS — R079 Chest pain, unspecified: Secondary | ICD-10-CM | POA: Diagnosis not present

## 2019-11-19 DIAGNOSIS — Z01419 Encounter for gynecological examination (general) (routine) without abnormal findings: Secondary | ICD-10-CM | POA: Diagnosis not present

## 2019-11-19 DIAGNOSIS — Z1389 Encounter for screening for other disorder: Secondary | ICD-10-CM | POA: Diagnosis not present

## 2019-11-19 DIAGNOSIS — Z1329 Encounter for screening for other suspected endocrine disorder: Secondary | ICD-10-CM | POA: Diagnosis not present

## 2019-11-19 DIAGNOSIS — Z13220 Encounter for screening for lipoid disorders: Secondary | ICD-10-CM | POA: Diagnosis not present

## 2019-11-19 DIAGNOSIS — Z0000 Encounter for general adult medical examination without abnormal findings: Secondary | ICD-10-CM | POA: Diagnosis not present

## 2019-11-20 ENCOUNTER — Other Ambulatory Visit (HOSPITAL_COMMUNITY): Payer: Self-pay | Admitting: Nurse Practitioner

## 2019-11-20 DIAGNOSIS — Z170 Estrogen receptor positive status [ER+]: Secondary | ICD-10-CM

## 2019-11-20 DIAGNOSIS — C50412 Malignant neoplasm of upper-outer quadrant of left female breast: Secondary | ICD-10-CM

## 2019-12-04 DIAGNOSIS — Z853 Personal history of malignant neoplasm of breast: Secondary | ICD-10-CM | POA: Diagnosis not present

## 2019-12-04 DIAGNOSIS — R319 Hematuria, unspecified: Secondary | ICD-10-CM | POA: Diagnosis not present

## 2019-12-04 DIAGNOSIS — N390 Urinary tract infection, site not specified: Secondary | ICD-10-CM | POA: Diagnosis not present

## 2019-12-04 DIAGNOSIS — R109 Unspecified abdominal pain: Secondary | ICD-10-CM | POA: Diagnosis not present

## 2019-12-05 ENCOUNTER — Other Ambulatory Visit (HOSPITAL_COMMUNITY): Payer: Self-pay | Admitting: Nurse Practitioner

## 2019-12-06 ENCOUNTER — Emergency Department (HOSPITAL_COMMUNITY): Payer: BC Managed Care – PPO

## 2019-12-06 ENCOUNTER — Other Ambulatory Visit: Payer: Self-pay

## 2019-12-06 ENCOUNTER — Emergency Department (HOSPITAL_COMMUNITY)
Admission: EM | Admit: 2019-12-06 | Discharge: 2019-12-06 | Disposition: A | Discharge status: Home / Self Care | Payer: BC Managed Care – PPO | Attending: Emergency Medicine | Admitting: Emergency Medicine

## 2019-12-06 ENCOUNTER — Encounter (HOSPITAL_COMMUNITY): Payer: Self-pay | Admitting: *Deleted

## 2019-12-06 DIAGNOSIS — Z9049 Acquired absence of other specified parts of digestive tract: Secondary | ICD-10-CM | POA: Diagnosis not present

## 2019-12-06 DIAGNOSIS — R1030 Lower abdominal pain, unspecified: Secondary | ICD-10-CM | POA: Diagnosis not present

## 2019-12-06 DIAGNOSIS — K5900 Constipation, unspecified: Secondary | ICD-10-CM | POA: Insufficient documentation

## 2019-12-06 DIAGNOSIS — Z79899 Other long term (current) drug therapy: Secondary | ICD-10-CM | POA: Diagnosis not present

## 2019-12-06 DIAGNOSIS — K828 Other specified diseases of gallbladder: Secondary | ICD-10-CM | POA: Diagnosis not present

## 2019-12-06 DIAGNOSIS — C50912 Malignant neoplasm of unspecified site of left female breast: Secondary | ICD-10-CM | POA: Insufficient documentation

## 2019-12-06 DIAGNOSIS — D259 Leiomyoma of uterus, unspecified: Secondary | ICD-10-CM | POA: Diagnosis not present

## 2019-12-06 DIAGNOSIS — R9389 Abnormal findings on diagnostic imaging of other specified body structures: Secondary | ICD-10-CM | POA: Diagnosis not present

## 2019-12-06 DIAGNOSIS — R1032 Left lower quadrant pain: Secondary | ICD-10-CM | POA: Diagnosis not present

## 2019-12-06 DIAGNOSIS — K7689 Other specified diseases of liver: Secondary | ICD-10-CM | POA: Diagnosis not present

## 2019-12-06 DIAGNOSIS — Z91040 Latex allergy status: Secondary | ICD-10-CM | POA: Diagnosis not present

## 2019-12-06 DIAGNOSIS — R935 Abnormal findings on diagnostic imaging of other abdominal regions, including retroperitoneum: Secondary | ICD-10-CM | POA: Diagnosis not present

## 2019-12-06 LAB — CBC WITH DIFFERENTIAL/PLATELET
Abs Immature Granulocytes: 0.02 10*3/uL (ref 0.00–0.07)
Basophils Absolute: 0.1 10*3/uL (ref 0.0–0.1)
Basophils Relative: 1 %
Eosinophils Absolute: 0.1 10*3/uL (ref 0.0–0.5)
Eosinophils Relative: 2 %
HCT: 41.9 % (ref 36.0–46.0)
Hemoglobin: 13.7 g/dL (ref 12.0–15.0)
Immature Granulocytes: 0 %
Lymphocytes Relative: 29 %
Lymphs Abs: 1.7 10*3/uL (ref 0.7–4.0)
MCH: 31.1 pg (ref 26.0–34.0)
MCHC: 32.7 g/dL (ref 30.0–36.0)
MCV: 95.2 fL (ref 80.0–100.0)
Monocytes Absolute: 0.6 10*3/uL (ref 0.1–1.0)
Monocytes Relative: 10 %
Neutro Abs: 3.4 10*3/uL (ref 1.7–7.7)
Neutrophils Relative %: 58 %
Platelets: 234 10*3/uL (ref 150–400)
RBC: 4.4 MIL/uL (ref 3.87–5.11)
RDW: 12.8 % (ref 11.5–15.5)
WBC: 5.9 10*3/uL (ref 4.0–10.5)
nRBC: 0 % (ref 0.0–0.2)

## 2019-12-06 LAB — URINALYSIS, ROUTINE W REFLEX MICROSCOPIC
Bilirubin Urine: NEGATIVE
Glucose, UA: NEGATIVE mg/dL
Ketones, ur: NEGATIVE mg/dL
Nitrite: NEGATIVE
Protein, ur: NEGATIVE mg/dL
Specific Gravity, Urine: 1.006 (ref 1.005–1.030)
pH: 6 (ref 5.0–8.0)

## 2019-12-06 LAB — COMPREHENSIVE METABOLIC PANEL
ALT: 26 U/L (ref 0–44)
AST: 28 U/L (ref 15–41)
Albumin: 4.2 g/dL (ref 3.5–5.0)
Alkaline Phosphatase: 52 U/L (ref 38–126)
Anion gap: 10 (ref 5–15)
BUN: 17 mg/dL (ref 6–20)
CO2: 27 mmol/L (ref 22–32)
Calcium: 8.9 mg/dL (ref 8.9–10.3)
Chloride: 101 mmol/L (ref 98–111)
Creatinine, Ser: 1.01 mg/dL — ABNORMAL HIGH (ref 0.44–1.00)
GFR calc Af Amer: >60 mL/min (ref >60)
GFR calc non Af Amer: >60 mL/min (ref >60)
Glucose, Bld: 89 mg/dL (ref 70–99)
Potassium: 3.9 mmol/L (ref 3.5–5.1)
Sodium: 138 mmol/L (ref 135–145)
Total Bilirubin: 0.4 mg/dL (ref 0.3–1.2)
Total Protein: 7.7 g/dL (ref 6.5–8.1)

## 2019-12-06 MED ORDER — SODIUM CHLORIDE 0.9 % IV BOLUS
500.0000 mL | Freq: Once | INTRAVENOUS | Status: AC
  Administered 2019-12-06: 500 mL via INTRAVENOUS

## 2019-12-06 MED ORDER — IOHEXOL 300 MG/ML  SOLN
100.0000 mL | Freq: Once | INTRAMUSCULAR | Status: AC | PRN
  Administered 2019-12-06: 100 mL via INTRAVENOUS

## 2019-12-06 NOTE — Discharge Instructions (Addendum)
The testing today did not show any serious problems.  It is important to treat constipation, which might improve your discomfort.  Try using MiraLAX, twice a day, for 1 week after that decrease to once a day, until you have normal daily bowel movements.  The CT scan showed a high density lesion near where your gallbladder was taken out.  This may be related to scarring from that procedure.  This abnormality does not seem to be causing your pain today.  To check this out further an MRI of the abdomen can be ordered by your primary care doctor.  This may be helpful also, to determine a cause for the pain that you were evaluated for today.

## 2019-12-06 NOTE — ED Provider Notes (Signed)
[facility] EMERGENCY DEPARTMENT Provider Note   CSN: [id number] Arrival date & time: 12/06/19  6063     History Chief Complaint  Patient presents with   Abdominal Pain    Haley Santana is a 59 y.o. female.  HPI She presents for evaluation of lower abdominal pain with recent diagnosis and treatment for UTI.  She also has pain in her left leg and her left upper back.  Some of pain present for several months, and is associated with constipation described as "hard stools."  She occasionally gets nauseated but does not have anorexia, vomiting, dysuria, urinary frequency or hematuria.  Prior cholecystectomy, and bilateral inguinal hernia repairs as an infant.  History of breast cancer, currently on tamoxifen.  No chest pain, shortness of breath, cough, weakness or dizziness.  There are no other known modifying factors.    Past Medical History:  Diagnosis Date   Arthritis    Breast cancer ([place]) 02/20/2017   left   Cancer ([place])    Diverticulitis    GERD (gastroesophageal reflux disease)    H/O benign breast biopsy 1984   right    Patient Active Problem List   Diagnosis Date Noted   Breast cancer of upper-outer quadrant of left female breast ([place]) 08/03/2018    Past Surgical History:  Procedure Laterality Date   BREAST LUMPECTOMY     CHOLECYSTECTOMY     CYST EXCISION     HERNIA REPAIR       OB History   No obstetric history on file.     Family History  Problem Relation Age of Onset   Cancer Mother    Cancer Father    Cancer Sister    Diabetes Brother    Cancer Brother     Social History   Tobacco Use   Smoking status: Never Smoker   Smokeless tobacco: Never Used  Substance Use Topics   Alcohol use: Yes   Drug use: Never    Home Medications Prior to Admission medications   Medication Sig Start Date End Date Taking? Authorizing Provider  cetirizine (ZYRTEC) 10 MG tablet Take 10 mg by mouth daily.   Yes [provider]    ciprofloxacin (CIPRO) 500 MG tablet Take 500 mg by mouth 2 (two) times daily. 12/04/19  Yes [provider]  Multiple Vitamins-Minerals (MULTIVITAMIN WITH MINERALS) tablet Take 1 tablet by mouth daily.   Yes [provider]  tamoxifen (NOLVADEX) 20 MG tablet TAKE 1 TABLET(20 MG) BY MOUTH DAILY Patient taking differently: Take 20 mg by mouth daily.  12/05/19  Yes Lockamy, Randi L, NP-C    Allergies    Dust mite extract, Latex, and Other  Review of Systems   Review of Systems  All other systems reviewed and are negative.   Physical Exam Updated Vital Signs BP 137/72    Pulse 66    Temp 97.9 F (36.6 C) (Oral)    Resp 16    Ht 5\' 5"  (1.651 m)    Wt 77.1 kg    SpO2 99%    BMI 28.29 kg/m   Physical Exam Vitals and nursing note reviewed.  Constitutional:      General: She is not in acute distress.    Appearance: She is well-developed. She is not ill-appearing, toxic-appearing or diaphoretic.  HENT:     Head: Normocephalic and atraumatic.     Right Ear: External ear normal.     Left Ear: External ear normal.  Eyes:     Conjunctiva/sclera:  Conjunctivae normal.     Pupils: Pupils are equal, round, and reactive to light.  Neck:     Trachea: Phonation normal.  Cardiovascular:     Rate and Rhythm: Normal rate and regular rhythm.     Heart sounds: Normal heart sounds.  Pulmonary:     Effort: Pulmonary effort is normal.     Breath sounds: Normal breath sounds.  Abdominal:     General: There is no distension.     Palpations: Abdomen is soft. There is no mass.     Tenderness: There is abdominal tenderness (Left lower, mild). There is no guarding or rebound.     Hernia: No hernia is present.  Musculoskeletal:        General: No swelling or tenderness. Normal range of motion.     Cervical back: Normal range of motion and neck supple.  Skin:    General: Skin is warm and dry.  Neurological:     Mental Status: She is alert and oriented to person, place, and time.      Cranial Nerves: No cranial nerve deficit.     Sensory: No sensory deficit.     Motor: No abnormal muscle tone.     Coordination: Coordination normal.  Psychiatric:        Mood and Affect: Mood normal.        Behavior: Behavior normal.        Thought Content: Thought content normal.        Judgment: Judgment normal.     ED Results / Procedures / Treatments   Labs (all labs ordered are listed, but only abnormal results are displayed) Labs Reviewed  COMPREHENSIVE METABOLIC PANEL - Abnormal; Notable for the following components:      Result Value   Creatinine, Ser 1.01 (*)    All other components within normal limits  URINALYSIS, ROUTINE W REFLEX MICROSCOPIC - Abnormal; Notable for the following components:   Color, Urine STRAW (*)    Hgb urine dipstick SMALL (*)    Leukocytes,Ua SMALL (*)    Bacteria, UA RARE (*)    Non Squamous Epithelial 0-5 (*)    All other components within normal limits  CBC WITH DIFFERENTIAL/PLATELET    EKG None  Radiology CT Abdomen Pelvis W Contrast  Result Date: 12/06/2019 CLINICAL DATA:  Concern for bowel obstruction. EXAM: CT ABDOMEN AND PELVIS WITH CONTRAST TECHNIQUE: Multidetector CT imaging of the abdomen and pelvis was performed using the standard protocol following bolus administration of intravenous contrast. CONTRAST:  14mL OMNIPAQUE IOHEXOL 300 MG/ML  SOLN COMPARISON:  None. FINDINGS: Lower chest: Lung bases are clear. Hepatobiliary: Geographic rectangular lesion along the gallbladder fossa within the LEFT hepatic lobe measures 2.6 x 1.4 cm (image 21/2). This is higher density than the adjacent liver parenchyma. No biliary duct dilatation. Gallbladder is normal. Common bile duct is normal. Pancreas: Pancreas is normal. No ductal dilatation. No pancreatic inflammation. Spleen: Normal spleen Adrenals/urinary tract: Adrenal glands and kidneys are normal. The ureters and bladder normal. Stomach/Bowel: Stomach, duodenum small-bowel normal. No evidence  of bowel obstruction or inflammation. Appendix normal. The ascending, transverse and descending colon normal. Several diverticula the descending colon sigmoid colon without acute acute inflammation. Rectum normal. Vascular/Lymphatic: Abdominal aorta is normal caliber with atherosclerotic calcification. There is no retroperitoneal or periportal lymphadenopathy. No pelvic lymphadenopathy. Reproductive: Round enhancing lesion in the RIGHT aspect of the uterine body most consistent with a benign leiomyoma. Adnexa unremarkable. Other: No free fluid. Musculoskeletal: No aggressive osseous lesion. IMPRESSION: 1.  No evidence of bowel obstruction or inflammation. 2. Normal appendix. 3. Postcholecystectomy. 4. Leiomyoma of the uterus. 5. High-density geographic lesion along the gallbladder fossa may represents focal fatty sparing. Consider MRI of the liver with contrast for confirmation. Electronically Signed   By: Suzy Bouchard M.D.   On: 12/06/2019 11:29    Procedures Procedures (including critical care time)  Medications Ordered in ED Medications  sodium chloride 0.9 % bolus 500 mL (0 mLs Intravenous Stopped 12/06/19 1046)  iohexol (OMNIPAQUE) 300 MG/ML solution 100 mL (100 mLs Intravenous Contrast Given 12/06/19 1020)    ED Course  I have reviewed the triage vital signs and the nursing notes.  Pertinent labs & imaging results that were available during my care of the patient were reviewed by me and considered in my medical decision making (see chart for details).  Clinical Course as of Dec 05 1224  Fri Dec 06, 2019  1209 Per radiologist, no acute intra-abdominal processes.  Questionable high density lesion of the liver, in the gallbladder fossa.  This can be further evaluated with MRI imaging, if needed.  CT Abdomen Pelvis W Contrast [EW]  1209 Normal except small amount of hemoglobin, leukocytes, WBCs and rare bacteria.  Urinalysis, Routine w reflex microscopic(!) [EW]  1209 Normal except mild  elevation of creatinine  Comprehensive metabolic panel(!) [EW]  1610 Normal  CBC with Differential [EW]    Clinical Course User Index [EW] Daleen Bo, MD   MDM Rules/Calculators/A&P                           Patient Vitals for the past 24 hrs:  BP Temp Temp src Pulse Resp SpO2 Height Weight  12/06/19 1101 -- -- -- 66 -- 99 % -- --  12/06/19 1031 -- -- -- 75 -- 99 % -- --  12/06/19 1001 137/72 -- -- (!) 55 -- 99 % -- --  12/06/19 0931 132/65 -- -- 62 -- 100 % -- --  12/06/19 0901 (!) 142/62 -- -- -- -- -- -- --  12/06/19 0858 -- -- -- 84 -- 99 % -- --  12/06/19 0845 -- -- -- -- -- -- 5\' 5"  (1.651 m) 77.1 kg  12/06/19 0843 (!) 149/76 97.9 F (36.6 C) Oral 66 16 99 % -- --    12:26 PM Reevaluation with update and discussion. After initial assessment and treatment, an updated evaluation reveals no change in status, she remains comfortable.  Findings discussed with the patient including the abnormal CT regarding a high density lesion of her liver.  This can be followed up expectantly as an outpatient with MRI imaging.  All findings discussed with patient and questions were answered. Daleen Bo   Medical Decision Making:  This patient is presenting for evaluation of ongoing abdominal pain for several months, with stool abnormality, constipation, which does require a range of treatment options, and is a complaint that involves a high risk of morbidity and mortality. The differential diagnoses include constipation, intra-abdominal mass, complication from hernia surgery as a child. I decided to review old records, and in summary relatively healthy middle-aged female, presenting with ongoing abdominal pain.  I did not require additional historical information from anyone.  Clinical Laboratory Tests Ordered, included CBC, Metabolic panel and Urinalysis. Review indicates reassuring and essentially normal labs.  Mildly abnormal urinalysis, without clearance for UTI. Radiologic Tests  Ordered, included CT abdomen pelvis.  I independently Visualized: CT images, which show high density lesion per  radiologist, in the right liver.  No other acute intra-abdominal processes.   Critical Interventions-clinical evaluation, laboratory testing, CT imaging, IV fluids, observation and reassessment  After These Interventions, the Patient was reevaluated and was found stable for discharge.  No acute intra-abdominal processes indicating infection, tumor or source for pain.  Incidental liver abnormality is nonspecific and can be evaluated expectantly as an outpatient.  Patient is aware of this finding.  She has a PCP to follow-up with.  CRITICAL CARE-no Performed by: Daleen Bo  Nursing Notes Reviewed/ Care Coordinated Applicable Imaging Reviewed Interpretation of Laboratory Data incorporated into ED treatment  The patient appears reasonably screened and/or stabilized for discharge and I doubt any other medical condition or other [facility] requiring further screening, evaluation, or treatment in the ED at this time prior to discharge.  Plan: Home Medications-continue usual, use MiraLAX twice a day for constipation; Home Treatments-gradual advance diet and activities; return here if the recommended treatment, does not improve the symptoms; Recommended follow up-PCP follow-up for further evaluation and treatment with scheduling of outpatient MRI imaging of the abdomen and pelvis to evaluate the high density lesion seen in the liver on CT imaging today.     Final Clinical Impression(s) / ED Diagnoses Final diagnoses:  Left lower quadrant abdominal pain  Constipation, unspecified constipation type  Abnormal CT scan    Rx / DC Orders ED Discharge Orders    None       Daleen Bo, MD 12/06/19 1229

## 2019-12-06 NOTE — ED Notes (Signed)
Patient is out to CT.

## 2019-12-06 NOTE — ED Triage Notes (Signed)
Pt c/o left lower abdominal pain x 4 days; pt was seen at urgent care x 2 days ago and was diagnosed with a UTI and given antibiotics; pt c/o left lower abdominal pain that radiates down her left leg and up into her upper left back

## 2019-12-17 DIAGNOSIS — R1032 Left lower quadrant pain: Secondary | ICD-10-CM | POA: Diagnosis not present

## 2019-12-17 DIAGNOSIS — N390 Urinary tract infection, site not specified: Secondary | ICD-10-CM | POA: Diagnosis not present

## 2019-12-17 DIAGNOSIS — R102 Pelvic and perineal pain: Secondary | ICD-10-CM | POA: Diagnosis not present

## 2019-12-17 DIAGNOSIS — M79652 Pain in left thigh: Secondary | ICD-10-CM | POA: Diagnosis not present

## 2019-12-18 ENCOUNTER — Other Ambulatory Visit: Payer: Self-pay | Admitting: Family Medicine

## 2019-12-18 DIAGNOSIS — R9389 Abnormal findings on diagnostic imaging of other specified body structures: Secondary | ICD-10-CM

## 2020-01-11 DIAGNOSIS — B370 Candidal stomatitis: Secondary | ICD-10-CM | POA: Diagnosis not present

## 2020-01-14 ENCOUNTER — Inpatient Hospital Stay: Admission: RE | Admit: 2020-01-14 | Payer: BC Managed Care – PPO | Source: Ambulatory Visit

## 2020-01-14 ENCOUNTER — Other Ambulatory Visit: Payer: BC Managed Care – PPO

## 2020-01-20 ENCOUNTER — Other Ambulatory Visit (HOSPITAL_COMMUNITY): Payer: Self-pay | Admitting: *Deleted

## 2020-01-20 DIAGNOSIS — Z170 Estrogen receptor positive status [ER+]: Secondary | ICD-10-CM

## 2020-01-21 ENCOUNTER — Ambulatory Visit (HOSPITAL_COMMUNITY)
Admission: RE | Admit: 2020-01-21 | Discharge: 2020-01-21 | Disposition: A | Discharge status: Home / Self Care | Payer: BC Managed Care – PPO | Source: Ambulatory Visit | Attending: Hematology | Admitting: Hematology

## 2020-01-21 ENCOUNTER — Encounter (HOSPITAL_COMMUNITY): Payer: BC Managed Care – PPO

## 2020-01-21 ENCOUNTER — Other Ambulatory Visit: Payer: Self-pay

## 2020-01-21 ENCOUNTER — Inpatient Hospital Stay (HOSPITAL_COMMUNITY): Payer: BC Managed Care – PPO | Attending: Hematology

## 2020-01-21 DIAGNOSIS — C50412 Malignant neoplasm of upper-outer quadrant of left female breast: Secondary | ICD-10-CM | POA: Insufficient documentation

## 2020-01-21 DIAGNOSIS — Z170 Estrogen receptor positive status [ER+]: Secondary | ICD-10-CM | POA: Insufficient documentation

## 2020-01-21 DIAGNOSIS — I890 Lymphedema, not elsewhere classified: Secondary | ICD-10-CM | POA: Insufficient documentation

## 2020-01-21 DIAGNOSIS — Z923 Personal history of irradiation: Secondary | ICD-10-CM | POA: Insufficient documentation

## 2020-01-21 DIAGNOSIS — R928 Other abnormal and inconclusive findings on diagnostic imaging of breast: Secondary | ICD-10-CM | POA: Diagnosis not present

## 2020-01-21 DIAGNOSIS — Z79810 Long term (current) use of selective estrogen receptor modulators (SERMs): Secondary | ICD-10-CM | POA: Insufficient documentation

## 2020-01-21 LAB — COMPREHENSIVE METABOLIC PANEL
ALT: 22 U/L (ref 0–44)
AST: 17 U/L (ref 15–41)
Albumin: 4 g/dL (ref 3.5–5.0)
Alkaline Phosphatase: 42 U/L (ref 38–126)
Anion gap: 8 (ref 5–15)
BUN: 17 mg/dL (ref 6–20)
CO2: 26 mmol/L (ref 22–32)
Calcium: 9.1 mg/dL (ref 8.9–10.3)
Chloride: 106 mmol/L (ref 98–111)
Creatinine, Ser: 0.9 mg/dL (ref 0.44–1.00)
GFR calc Af Amer: >60 mL/min (ref >60)
GFR calc non Af Amer: >60 mL/min (ref >60)
Glucose, Bld: 95 mg/dL (ref 70–99)
Potassium: 4.3 mmol/L (ref 3.5–5.1)
Sodium: 140 mmol/L (ref 135–145)
Total Bilirubin: 0.5 mg/dL (ref 0.3–1.2)
Total Protein: 6.8 g/dL (ref 6.5–8.1)

## 2020-01-21 LAB — CBC WITH DIFFERENTIAL/PLATELET
Abs Immature Granulocytes: 0.03 10*3/uL (ref 0.00–0.07)
Basophils Absolute: 0.1 10*3/uL (ref 0.0–0.1)
Basophils Relative: 1 %
Eosinophils Absolute: 0.2 10*3/uL (ref 0.0–0.5)
Eosinophils Relative: 3 %
HCT: 39.7 % (ref 36.0–46.0)
Hemoglobin: 12.9 g/dL (ref 12.0–15.0)
Immature Granulocytes: 1 %
Lymphocytes Relative: 35 %
Lymphs Abs: 1.8 10*3/uL (ref 0.7–4.0)
MCH: 30.9 pg (ref 26.0–34.0)
MCHC: 32.5 g/dL (ref 30.0–36.0)
MCV: 95.2 fL (ref 80.0–100.0)
Monocytes Absolute: 0.7 10*3/uL (ref 0.1–1.0)
Monocytes Relative: 13 %
Neutro Abs: 2.5 10*3/uL (ref 1.7–7.7)
Neutrophils Relative %: 47 %
Platelets: 226 10*3/uL (ref 150–400)
RBC: 4.17 MIL/uL (ref 3.87–5.11)
RDW: 13 % (ref 11.5–15.5)
WBC: 5.1 10*3/uL (ref 4.0–10.5)
nRBC: 0 % (ref 0.0–0.2)

## 2020-01-21 LAB — LACTATE DEHYDROGENASE: LDH: 149 U/L (ref 98–192)

## 2020-01-28 ENCOUNTER — Ambulatory Visit (HOSPITAL_COMMUNITY): Payer: BC Managed Care – PPO | Admitting: Nurse Practitioner

## 2020-02-04 ENCOUNTER — Inpatient Hospital Stay (HOSPITAL_BASED_OUTPATIENT_CLINIC_OR_DEPARTMENT_OTHER): Payer: BC Managed Care – PPO | Admitting: Nurse Practitioner

## 2020-02-04 ENCOUNTER — Other Ambulatory Visit: Payer: Self-pay

## 2020-02-04 VITALS — BP 125/68 | HR 74 | Temp 96.9°F | Resp 18 | Wt 171.9 lb

## 2020-02-04 DIAGNOSIS — Z170 Estrogen receptor positive status [ER+]: Secondary | ICD-10-CM | POA: Diagnosis not present

## 2020-02-04 DIAGNOSIS — C50412 Malignant neoplasm of upper-outer quadrant of left female breast: Secondary | ICD-10-CM | POA: Diagnosis not present

## 2020-02-04 DIAGNOSIS — Z923 Personal history of irradiation: Secondary | ICD-10-CM | POA: Diagnosis not present

## 2020-02-04 DIAGNOSIS — Z79810 Long term (current) use of selective estrogen receptor modulators (SERMs): Secondary | ICD-10-CM | POA: Diagnosis not present

## 2020-02-04 DIAGNOSIS — I890 Lymphedema, not elsewhere classified: Secondary | ICD-10-CM | POA: Diagnosis not present

## 2020-02-04 NOTE — Progress Notes (Signed)
[facility] [place], [place] [address]   CLINIC:  Medical Oncology/Hematology  PCP:  Lin Landsman, [place] [address] [phone]   REASON FOR VISIT: Follow-up for breast cancer   CURRENT THERAPY: Tamoxifen  BRIEF ONCOLOGIC HISTORY:  Oncology History  Breast cancer of upper-outer quadrant of left female breast ([place])  08/03/2018 Initial Diagnosis   Breast cancer of upper-outer quadrant of left female breast ([place])   08/03/2018 Cancer Staging   Staging form: Breast, AJCC 8th Edition - Clinical stage from 08/03/2018: Stage IA (cT1a, cN0, cM0, G1, ER+, PR+, HER2-) - Signed by Derek Jack, MD on 08/03/2018     CANCER STAGING: Cancer Staging Breast cancer of upper-outer quadrant of left female breast [facility]) Staging form: Breast, AJCC 8th Edition - Clinical stage from 08/03/2018: Stage IA (cT1a, cN0, cM0, G1, ER+, PR+, HER2-) - Signed by Derek Jack, MD on 08/03/2018    INTERVAL HISTORY:  Haley Santana 59 y.o. female returns for routine follow-up for breast cancer. Patient reports she is taking her tamoxifen as prescribed with no unwanted side effects. She does report it leaves a bad taste in her mouth occasionally. She denies any new lumps or bumps felt. She denies any new bone pain. Denies any nausea, vomiting, or diarrhea. Denies any new pains. Had not noticed any recent bleeding such as epistaxis, hematuria or hematochezia. Denies recent chest pain on exertion, shortness of breath on minimal exertion, pre-syncopal episodes, or palpitations. Denies any numbness or tingling in hands or feet. Denies any recent fevers, infections, or recent hospitalizations. Patient reports appetite at 100% and energy level at 0%. She is eating well maintain her weight at this time.     REVIEW OF SYSTEMS:  Review of Systems  Constitutional: Positive for fatigue.  Neurological: Positive for dizziness and headaches.    Psychiatric/Behavioral: Positive for depression.  All other systems reviewed and are negative.    PAST MEDICAL/SURGICAL HISTORY:  Past Medical History:  Diagnosis Date  . Arthritis   . Breast cancer ([place]) 02/20/2017   left  . Cancer ([place])   . Diverticulitis   . GERD (gastroesophageal reflux disease)   . H/O benign breast biopsy 1984   right   Past Surgical History:  Procedure Laterality Date  . BREAST LUMPECTOMY    . CHOLECYSTECTOMY    . CYST EXCISION    . HERNIA REPAIR       SOCIAL HISTORY:  Social History   Socioeconomic History  . Marital status: Single    Spouse name: Not on file  . Number of children: Not on file  . Years of education: Not on file  . Highest education level: Not on file  Occupational History  . Not on file  Tobacco Use  . Smoking status: Never Smoker  . Smokeless tobacco: Never Used  Substance and Sexual Activity  . Alcohol use: Yes  . Drug use: Never  . Sexual activity: Not on file  Other Topics Concern  . Not on file  Social History Narrative  . Not on file   Social Determinants of Health   Financial Resource Strain:   . Difficulty of Paying Living Expenses: Not on file  Food Insecurity:   . Worried About Charity fundraiser in the Last Year: Not on file  . Ran Out of Food in the Last Year: Not on file  Transportation Needs:   . Lack of Transportation (Medical): Not on file  . Lack of  Transportation (Non-Medical): Not on file  Physical Activity:   . Days of Exercise per Week: Not on file  . Minutes of Exercise per Session: Not on file  Stress:   . Feeling of Stress : Not on file  Social Connections:   . Frequency of Communication with Friends and Family: Not on file  . Frequency of Social Gatherings with Friends and Family: Not on file  . Attends Religious Services: Not on file  . Active Member of Clubs or Organizations: Not on file  . Attends Archivist Meetings: Not on file  . Marital Status: Not on file   Intimate Partner Violence:   . Fear of Current or Ex-Partner: Not on file  . Emotionally Abused: Not on file  . Physically Abused: Not on file  . Sexually Abused: Not on file    FAMILY HISTORY:  Family History  Problem Relation Age of Onset  . Cancer Mother   . Cancer Father   . Cancer Sister   . Diabetes Brother   . Cancer Brother     CURRENT MEDICATIONS:  Outpatient Encounter Medications as of 02/04/2020  Medication Sig  . tamoxifen (NOLVADEX) 20 MG tablet TAKE 1 TABLET(20 MG) BY MOUTH DAILY (Patient taking differently: Take 20 mg by mouth daily. )  . cetirizine (ZYRTEC) 10 MG tablet Take 10 mg by mouth daily. (Patient not taking: Reported on 02/04/2020)  . Multiple Vitamins-Minerals (MULTIVITAMIN WITH MINERALS) tablet Take 1 tablet by mouth daily. (Patient not taking: Reported on 02/04/2020)  . [DISCONTINUED] ciprofloxacin (CIPRO) 500 MG tablet Take 500 mg by mouth 2 (two) times daily.   No facility-administered encounter medications on file as of 02/04/2020.    ALLERGIES:  Allergies  Allergen Reactions  . Dust Mite Extract   . Latex   . Other     Tree nuts     PHYSICAL EXAM:  ECOG Performance status: 1  Vitals:   02/04/20 0901  BP: 125/68  Pulse: 74  Resp: 18  Temp: (!) 96.9 F (36.1 C)  SpO2: 99%   Filed Weights   02/04/20 0901  Weight: 171 lb 14.4 oz (78 kg)   Physical Exam Constitutional:      Appearance: Normal appearance. She is normal weight.  Cardiovascular:     Rate and Rhythm: Normal rate and regular rhythm.     Heart sounds: Normal heart sounds.  Pulmonary:     Effort: Pulmonary effort is normal.     Breath sounds: Normal breath sounds.  Abdominal:     General: Bowel sounds are normal.     Palpations: Abdomen is soft.  Musculoskeletal:        General: Normal range of motion.  Skin:    General: Skin is warm.  Neurological:     Mental Status: She is alert and oriented to person, place, and time. Mental status is at baseline.   Psychiatric:        Mood and Affect: Mood normal.        Behavior: Behavior normal.        Thought Content: Thought content normal.        Judgment: Judgment normal.      LABORATORY DATA:  I have reviewed the labs as listed.  CBC    Component Value Date/Time   WBC 5.1 01/21/2020 0902   RBC 4.17 01/21/2020 0902   HGB 12.9 01/21/2020 0902   HCT 39.7 01/21/2020 0902   PLT 226 01/21/2020 0902   MCV 95.2 01/21/2020 0902  MCH 30.9 01/21/2020 0902   MCHC 32.5 01/21/2020 0902   RDW 13.0 01/21/2020 0902   LYMPHSABS 1.8 01/21/2020 0902   MONOABS 0.7 01/21/2020 0902   EOSABS 0.2 01/21/2020 0902   BASOSABS 0.1 01/21/2020 0902   CMP Latest Ref Rng & Units 01/21/2020 12/06/2019 01/08/2019  Glucose 70 - 99 mg/dL 95 89 88  BUN 6 - 20 mg/dL _0 Creatinine 0.44 - 1.00 mg/dL 0.90 1.01(H) 0.88  Sodium 135 - 145 mmol/L 140 138 141  Potassium 3.5 - 5.1 mmol/L 4.3 3.9 4.4  Chloride 98 - 111 mmol/L 106 101 108  CO2 22 - 32 mmol/L _1 Calcium 8.9 - 10.3 mg/dL 9.1 8.9 9.1  Total Protein 6.5 - 8.1 g/dL 6.8 7.7 7.0  Total Bilirubin 0.3 - 1.2 mg/dL 0.5 0.4 0.5  Alkaline Phos 38 - 126 U/L 42 52 47  AST 15 - 41 U/L _2 ALT 0 - 44 U/L _3 DIAGNOSTIC IMAGING:  I have independently reviewed the mammogram scans and discussed with the patient.  ASSESSMENT & PLAN:  Breast cancer of upper-outer quadrant of left female breast ([place]) 1. 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 -Partial breast radiation therapy dose of 34 Gray at 3.4GY per fraction given twice daily 6 hours apart in 10 fractions completed on 03/24/2017 at [facility]. -Tamoxifen started on 03/2017. She is tolerating well -Myriad myrisk-heterozygosity for MUTYH with slightly  increased risk for colon cancer. -Colonoscopy in 2019 was reportedly negative. -Bilateral mammogram done 03/20/2020 showed the RADS category 2 benign. -Recommended patient continue on tamoxifen. -Labs done on 01/21/2020 were all WNL -Physical examination did not reveal any palpable masses. There is mild lymphedema on the left breast. -She will return to clinic in 1 year with repeat labs and mammogram.  2. Bone health: -DEXA scan completed 12/2018 showed normal bone density. -Recommended patient to continue vitamin D and calcium daily.     Orders placed this encounter:  Orders Placed This Encounter  Procedures  . MM DIAG BREAST TOMO BILATERAL  . Lactate dehydrogenase  . CBC with Differential/Platelet  . Comprehensive metabolic panel  . Vitamin B12  . VITAMIN D 25 Hydroxy (Vit-D Deficiency, Fractures)  . Folate      Francene Finders, FNP-C [place] [phone]

## 2020-02-04 NOTE — Assessment & Plan Note (Signed)
1. 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 -Partial breast radiation therapy dose of 34 Gray at 3.4GY per fraction given twice daily 6 hours apart in 10 fractions completed on 03/24/2017 at [facility]. -Tamoxifen started on 03/2017. She is tolerating well -Myriad myrisk-heterozygosity for MUTYH with slightly increased risk for colon cancer. -Colonoscopy in 2019 was reportedly negative. -Bilateral mammogram done 03/20/2020 showed the RADS category 2 benign. -Recommended patient continue on tamoxifen. -Labs done on 01/21/2020 were all WNL -Physical examination did not reveal any palpable masses. There is mild lymphedema on the left breast. -She will return to clinic in 1 year with repeat labs and mammogram.  2. Bone health: -DEXA scan completed 12/2018 showed normal bone density. -Recommended patient to continue vitamin D and calcium daily.

## 2020-02-14 DIAGNOSIS — M549 Dorsalgia, unspecified: Secondary | ICD-10-CM | POA: Diagnosis not present

## 2020-02-14 DIAGNOSIS — G8929 Other chronic pain: Secondary | ICD-10-CM | POA: Diagnosis not present

## 2020-02-14 DIAGNOSIS — K219 Gastro-esophageal reflux disease without esophagitis: Secondary | ICD-10-CM | POA: Diagnosis not present

## 2020-02-14 DIAGNOSIS — M542 Cervicalgia: Secondary | ICD-10-CM | POA: Diagnosis not present

## 2020-03-13 DIAGNOSIS — Z1331 Encounter for screening for depression: Secondary | ICD-10-CM | POA: Diagnosis not present

## 2020-03-13 DIAGNOSIS — M549 Dorsalgia, unspecified: Secondary | ICD-10-CM | POA: Diagnosis not present

## 2020-03-13 DIAGNOSIS — Z853 Personal history of malignant neoplasm of breast: Secondary | ICD-10-CM | POA: Diagnosis not present

## 2020-03-13 DIAGNOSIS — M542 Cervicalgia: Secondary | ICD-10-CM | POA: Diagnosis not present

## 2020-03-13 DIAGNOSIS — K219 Gastro-esophageal reflux disease without esophagitis: Secondary | ICD-10-CM | POA: Diagnosis not present

## 2020-03-26 DIAGNOSIS — R1032 Left lower quadrant pain: Secondary | ICD-10-CM | POA: Diagnosis not present

## 2020-03-26 DIAGNOSIS — N281 Cyst of kidney, acquired: Secondary | ICD-10-CM | POA: Diagnosis not present

## 2020-04-13 ENCOUNTER — Other Ambulatory Visit (HOSPITAL_COMMUNITY): Payer: Self-pay | Admitting: Surgery

## 2020-04-13 DIAGNOSIS — Z170 Estrogen receptor positive status [ER+]: Secondary | ICD-10-CM

## 2020-04-13 MED ORDER — TAMOXIFEN CITRATE 20 MG PO TABS: ORAL_TABLET | ORAL | 6 refills | Status: DC

## 2020-04-14 DIAGNOSIS — M542 Cervicalgia: Secondary | ICD-10-CM | POA: Diagnosis not present

## 2020-04-14 DIAGNOSIS — M549 Dorsalgia, unspecified: Secondary | ICD-10-CM | POA: Diagnosis not present

## 2020-04-14 DIAGNOSIS — E559 Vitamin D deficiency, unspecified: Secondary | ICD-10-CM | POA: Diagnosis not present

## 2020-04-14 DIAGNOSIS — G8929 Other chronic pain: Secondary | ICD-10-CM | POA: Diagnosis not present

## 2020-04-14 DIAGNOSIS — K219 Gastro-esophageal reflux disease without esophagitis: Secondary | ICD-10-CM | POA: Diagnosis not present

## 2020-04-24 DIAGNOSIS — R102 Pelvic and perineal pain: Secondary | ICD-10-CM | POA: Diagnosis not present

## 2020-04-24 DIAGNOSIS — R9389 Abnormal findings on diagnostic imaging of other specified body structures: Secondary | ICD-10-CM | POA: Diagnosis not present

## 2020-04-24 DIAGNOSIS — D252 Subserosal leiomyoma of uterus: Secondary | ICD-10-CM | POA: Diagnosis not present

## 2020-04-28 DIAGNOSIS — M542 Cervicalgia: Secondary | ICD-10-CM | POA: Diagnosis not present

## 2020-04-28 DIAGNOSIS — G8929 Other chronic pain: Secondary | ICD-10-CM | POA: Diagnosis not present

## 2020-04-28 DIAGNOSIS — K219 Gastro-esophageal reflux disease without esophagitis: Secondary | ICD-10-CM | POA: Diagnosis not present

## 2020-04-28 DIAGNOSIS — M549 Dorsalgia, unspecified: Secondary | ICD-10-CM | POA: Diagnosis not present

## 2020-04-30 DIAGNOSIS — R102 Pelvic and perineal pain: Secondary | ICD-10-CM | POA: Diagnosis not present

## 2020-04-30 DIAGNOSIS — Z6829 Body mass index (BMI) 29.0-29.9, adult: Secondary | ICD-10-CM | POA: Diagnosis not present

## 2020-04-30 DIAGNOSIS — R9389 Abnormal findings on diagnostic imaging of other specified body structures: Secondary | ICD-10-CM | POA: Diagnosis not present

## 2020-04-30 DIAGNOSIS — D219 Benign neoplasm of connective and other soft tissue, unspecified: Secondary | ICD-10-CM | POA: Diagnosis not present

## 2020-05-28 DIAGNOSIS — G8929 Other chronic pain: Secondary | ICD-10-CM | POA: Diagnosis not present

## 2020-05-28 DIAGNOSIS — R102 Pelvic and perineal pain: Secondary | ICD-10-CM | POA: Diagnosis not present

## 2020-05-28 DIAGNOSIS — N390 Urinary tract infection, site not specified: Secondary | ICD-10-CM | POA: Diagnosis not present

## 2020-05-28 DIAGNOSIS — Z853 Personal history of malignant neoplasm of breast: Secondary | ICD-10-CM | POA: Diagnosis not present

## 2020-06-11 DIAGNOSIS — K589 Irritable bowel syndrome without diarrhea: Secondary | ICD-10-CM | POA: Diagnosis not present

## 2020-06-11 DIAGNOSIS — R102 Pelvic and perineal pain: Secondary | ICD-10-CM | POA: Diagnosis not present

## 2020-06-11 DIAGNOSIS — Z853 Personal history of malignant neoplasm of breast: Secondary | ICD-10-CM | POA: Diagnosis not present

## 2020-06-11 DIAGNOSIS — G8929 Other chronic pain: Secondary | ICD-10-CM | POA: Diagnosis not present

## 2020-06-22 DIAGNOSIS — N12 Tubulo-interstitial nephritis, not specified as acute or chronic: Secondary | ICD-10-CM | POA: Diagnosis not present

## 2020-08-18 DIAGNOSIS — G8929 Other chronic pain: Secondary | ICD-10-CM | POA: Diagnosis not present

## 2020-08-18 DIAGNOSIS — R102 Pelvic and perineal pain: Secondary | ICD-10-CM | POA: Diagnosis not present

## 2020-08-18 DIAGNOSIS — K589 Irritable bowel syndrome without diarrhea: Secondary | ICD-10-CM | POA: Diagnosis not present

## 2020-08-18 DIAGNOSIS — Z853 Personal history of malignant neoplasm of breast: Secondary | ICD-10-CM | POA: Diagnosis not present

## 2020-08-20 ENCOUNTER — Encounter: Payer: Self-pay | Admitting: Internal Medicine

## 2020-09-07 DIAGNOSIS — M5416 Radiculopathy, lumbar region: Secondary | ICD-10-CM | POA: Diagnosis not present

## 2020-09-07 DIAGNOSIS — M545 Low back pain, unspecified: Secondary | ICD-10-CM | POA: Diagnosis not present

## 2020-09-07 DIAGNOSIS — M25551 Pain in right hip: Secondary | ICD-10-CM | POA: Diagnosis not present

## 2020-09-12 ENCOUNTER — Ambulatory Visit
Admission: EM | Admit: 2020-09-12 | Discharge: 2020-09-12 | Disposition: A | Discharge status: Home / Self Care | Payer: BC Managed Care – PPO | Attending: Emergency Medicine | Admitting: Emergency Medicine

## 2020-09-12 ENCOUNTER — Encounter: Payer: Self-pay | Admitting: Emergency Medicine

## 2020-09-12 ENCOUNTER — Other Ambulatory Visit: Payer: Self-pay

## 2020-09-12 DIAGNOSIS — R350 Frequency of micturition: Secondary | ICD-10-CM | POA: Insufficient documentation

## 2020-09-12 LAB — POCT URINALYSIS DIP (MANUAL ENTRY)
Bilirubin, UA: NEGATIVE
Blood, UA: NEGATIVE
Glucose, UA: NEGATIVE mg/dL
Ketones, POC UA: NEGATIVE mg/dL
Nitrite, UA: NEGATIVE
Protein Ur, POC: NEGATIVE mg/dL
Spec Grav, UA: 1.015 (ref 1.010–1.025)
Urobilinogen, UA: 0.2 E.U./dL
pH, UA: 5.5 (ref 5.0–8.0)

## 2020-09-12 MED ORDER — CEPHALEXIN 500 MG PO CAPS: 500.0000 mg | ORAL_CAPSULE | Freq: Three times a day (TID) | ORAL | 0 refills | Status: AC

## 2020-09-12 NOTE — ED Triage Notes (Signed)
Lower back pain and urinary frequency x a few days.

## 2020-09-12 NOTE — Discharge Instructions (Signed)
Urine concerning for infection Urine culture sent.  We will call you with the results.   Push fluids and get plenty of rest.   Take antibiotic as directed and to completion Follow up with PCP if symptoms persists Return here or go to ER if you have any new or worsening symptoms such as fever, worsening abdominal pain, nausea/vomiting, flank pain, etc... 

## 2020-09-12 NOTE — ED Provider Notes (Signed)
[facility]   CC: Urinary frequency  SUBJECTIVE:  Haley Santana is a 60 y.o. female who complains of urinary frequency x 2 days.  Patient denies a precipitating event, recent sexual encounter, excessive caffeine intake.  Reports low back pain, but this is chronic in nature.  Denies alleviating factors.  Symptoms are made worse with urination.  Admits to similar symptoms in the past.  Denies fever, chills, nausea, vomiting, abdominal pain, flank pain, hematuria.    LMP: No LMP recorded. Patient is postmenopausal.  ROS: As in HPI.  All other pertinent ROS negative.     Past Medical History:  Diagnosis Date  . Arthritis   . Breast cancer ([place]) 02/20/2017   left  . Cancer ([place])   . Diverticulitis   . GERD (gastroesophageal reflux disease)   . H/O benign breast biopsy 1984   right   Past Surgical History:  Procedure Laterality Date  . BREAST LUMPECTOMY    . CHOLECYSTECTOMY    . CYST EXCISION    . HERNIA REPAIR     Allergies  Allergen Reactions  . Dust Mite Extract   . Latex   . Other     Tree nuts   No current facility-administered medications on file prior to encounter.   Current Outpatient Medications on File Prior to Encounter  Medication Sig Dispense Refill  . tamoxifen (NOLVADEX) 20 MG tablet TAKE 1 TABLET(20 MG) BY MOUTH DAILY 30 tablet 6  . [DISCONTINUED] cetirizine (ZYRTEC) 10 MG tablet Take 10 mg by mouth daily. (Patient not taking: Reported on 02/04/2020)     Social History   Socioeconomic History  . Marital status: Single    Spouse name: Not on file  . Number of children: Not on file  . Years of education: Not on file  . Highest education level: Not on file  Occupational History  . Not on file  Tobacco Use  . Smoking status: Never Smoker  . Smokeless tobacco: Never Used  Substance and Sexual Activity  . Alcohol use: Yes  . Drug use: Never  . Sexual activity: Not on file  Other Topics Concern  . Not on file  Social History Narrative  .  Not on file   Social Determinants of Health   Financial Resource Strain: Not on file  Food Insecurity: Not on file  Transportation Needs: Not on file  Physical Activity: Not on file  Stress: Not on file  Social Connections: Not on file  Intimate Partner Violence: Not on file   Family History  Problem Relation Age of Onset  . Cancer Mother   . Cancer Father   . Cancer Sister   . Diabetes Brother   . Cancer Brother     OBJECTIVE:  Vitals:   09/12/20 0914  BP: (!) 141/88  Pulse: 93  Resp: 16  Temp: 98.1 F (36.7 C)  TempSrc: Oral  SpO2: 96%   General appearance: AOx3 in no acute distress HEENT: NCAT.  Oropharynx clear.  Lungs: clear to auscultation bilaterally without adventitious breath sounds Heart: regular rate and rhythm.   Abdomen: soft; non-distended; no tenderness; bowel sounds present; no guarding or rebound tenderness Back: no CVA tenderness Extremities: no edema; symmetrical with no gross deformities Skin: warm and dry Neurologic: Ambulates from chair to exam table without difficulty Psychological: alert and cooperative; normal mood and affect  Labs Reviewed  POCT URINALYSIS DIP (MANUAL ENTRY) - Abnormal; Notable for the following components:      Result Value   Clarity,  UA cloudy (*)    Leukocytes, UA Small (1+) (*)    All other components within normal limits  URINE CULTURE    ASSESSMENT & PLAN:  1. Urinary frequency     Meds ordered this encounter  Medications  . cephALEXin (KEFLEX) 500 MG capsule    Sig: Take 1 capsule (500 mg total) by mouth 3 (three) times daily for 10 days.    Dispense:  30 capsule    Refill:  0    Order Specific Question:   Supervising Provider    Answer:   Raylene Everts [[id number]]   Urine concerning for infection Urine culture sent.  We will call you with the results.   Push fluids and get plenty of rest.   Take antibiotic as directed and to completion Follow up with PCP if symptoms persists Return here or go  to ER if you have any new or worsening symptoms such as fever, worsening abdominal pain, nausea/vomiting, flank pain, etc...  Outlined signs and symptoms indicating need for more acute intervention. Patient verbalized understanding. After Visit Summary given.     Lestine Box, PA-C 09/12/20 [phone]

## 2020-09-13 LAB — URINE CULTURE: Culture: <10000 — AB

## 2020-09-14 DIAGNOSIS — M9905 Segmental and somatic dysfunction of pelvic region: Secondary | ICD-10-CM | POA: Diagnosis not present

## 2020-09-14 DIAGNOSIS — M9903 Segmental and somatic dysfunction of lumbar region: Secondary | ICD-10-CM | POA: Diagnosis not present

## 2020-09-14 DIAGNOSIS — M9902 Segmental and somatic dysfunction of thoracic region: Secondary | ICD-10-CM | POA: Diagnosis not present

## 2020-09-14 DIAGNOSIS — M5136 Other intervertebral disc degeneration, lumbar region: Secondary | ICD-10-CM | POA: Diagnosis not present

## 2020-09-18 DIAGNOSIS — M5136 Other intervertebral disc degeneration, lumbar region: Secondary | ICD-10-CM | POA: Diagnosis not present

## 2020-09-18 DIAGNOSIS — M9902 Segmental and somatic dysfunction of thoracic region: Secondary | ICD-10-CM | POA: Diagnosis not present

## 2020-09-18 DIAGNOSIS — M9905 Segmental and somatic dysfunction of pelvic region: Secondary | ICD-10-CM | POA: Diagnosis not present

## 2020-09-18 DIAGNOSIS — M9903 Segmental and somatic dysfunction of lumbar region: Secondary | ICD-10-CM | POA: Diagnosis not present

## 2020-09-21 DIAGNOSIS — M9902 Segmental and somatic dysfunction of thoracic region: Secondary | ICD-10-CM | POA: Diagnosis not present

## 2020-09-21 DIAGNOSIS — M5136 Other intervertebral disc degeneration, lumbar region: Secondary | ICD-10-CM | POA: Diagnosis not present

## 2020-09-21 DIAGNOSIS — M9905 Segmental and somatic dysfunction of pelvic region: Secondary | ICD-10-CM | POA: Diagnosis not present

## 2020-09-21 DIAGNOSIS — M9903 Segmental and somatic dysfunction of lumbar region: Secondary | ICD-10-CM | POA: Diagnosis not present

## 2020-09-24 ENCOUNTER — Other Ambulatory Visit: Payer: Self-pay

## 2020-09-24 ENCOUNTER — Encounter: Payer: Self-pay | Admitting: Internal Medicine

## 2020-09-24 ENCOUNTER — Ambulatory Visit (INDEPENDENT_AMBULATORY_CARE_PROVIDER_SITE_OTHER): Payer: BC Managed Care – PPO | Admitting: Internal Medicine

## 2020-09-24 VITALS — BP 137/76 | HR 81 | Temp 97.0°F | Ht 66.0 in | Wt 175.4 lb

## 2020-09-24 DIAGNOSIS — K219 Gastro-esophageal reflux disease without esophagitis: Secondary | ICD-10-CM

## 2020-09-24 DIAGNOSIS — R1319 Other dysphagia: Secondary | ICD-10-CM

## 2020-09-24 DIAGNOSIS — R197 Diarrhea, unspecified: Secondary | ICD-10-CM | POA: Diagnosis not present

## 2020-09-24 DIAGNOSIS — R110 Nausea: Secondary | ICD-10-CM

## 2020-09-24 DIAGNOSIS — K625 Hemorrhage of anus and rectum: Secondary | ICD-10-CM | POA: Diagnosis not present

## 2020-09-24 MED ORDER — PEG 3350-KCL-NA BICARB-NACL 420 G PO SOLR: 4000.0000 mL | ORAL | 0 refills | Status: DC

## 2020-09-24 NOTE — H&P (View-Only) (Signed)
Primary Care Physician:  Andrews Nation, MD Primary Gastroenterologist:  Dr. Abbey Chatters  Chief Complaint  Patient presents with  . Nausea    With eating, no recent vomiting  . Abdominal Pain    Upper abd, stinging sensation under skin, RUQ/LUQ as well    HPI:   Haley Santana is a 60 y.o. female who presents to the clinic today by referral from her PCP Dr. Jimmye Norman for evaluation.  She has multiple GI complaints for me today.  She states she has chronic nausea primarily after meals though will occasionally happen between meals as well.  She has intermittent acid reflux as well for which she takes chewable Tums.  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  Does note some occasional dysphagia and substernal tightness when she eats.  No odynophagia.  Also notes loose stools up to 5 bowel movements daily.  Is also seeing blood on toilet paper.  No blood in the toilet bowl itself.  Also having left lower quadrant abdominal pain.  Does not radiate.  She states she recently started taking digestive enzymes over-the-counter and this is improving her symptoms.  Last colonoscopy she reports was in 2018 in [place] which she states was WNL.  No family history of colorectal malignancy.  No unintentional weight loss.  No chronic NSAID use.  Past Medical History:  Diagnosis Date  . Arthritis   . Breast cancer ([place]) 02/20/2017   left  . Cancer ([place])   . Diverticulitis   . GERD (gastroesophageal reflux disease)   . H/O benign breast biopsy 1984   right    Past Surgical History:  Procedure Laterality Date  . BREAST LUMPECTOMY    . CHOLECYSTECTOMY    . CYST EXCISION    . HERNIA REPAIR      Current Outpatient Medications  Medication Sig Dispense Refill  . OVER THE COUNTER MEDICATION Support enzymes 3 times per day    . tamoxifen (NOLVADEX) 20 MG tablet TAKE 1 TABLET(20 MG) BY MOUTH DAILY 30 tablet 6   No current facility-administered  medications for this visit.    Allergies as of 09/24/2020 - Review Complete 09/24/2020  Allergen Reaction Noted  . Dust mite extract  07/24/2018  . Latex  07/24/2018  . Other  07/24/2018    Family History  Problem Relation Age of Onset  . Cancer Mother   . Cancer Father   . Cancer Sister   . Diabetes Brother   . Cancer Brother     Social History   Socioeconomic History  . Marital status: Single    Spouse name: Not on file  . Number of children: Not on file  . Years of education: Not on file  . Highest education level: Not on file  Occupational History  . Not on file  Tobacco Use  . Smoking status: Former Research scientist (life sciences)  . Smokeless tobacco: Never Used  Substance and Sexual Activity  . Alcohol use: Yes    Comment: 1-2 times per year  . Drug use: Never  . Sexual activity: Not on file  Other Topics Concern  . Not on file  Social History Narrative  . Not on file   Social Determinants of Health   Financial Resource Strain: Not on file  Food Insecurity: Not on file  Transportation Needs: Not on file  Physical Activity: Not on file  Stress: Not on file  Social Connections: Not on file  Intimate  Partner Violence: Not on file    Subjective: Review of Systems  Constitutional: Negative for chills and fever.  HENT: Negative for congestion and hearing loss.   Eyes: Negative for blurred vision and double vision.  Respiratory: Negative for cough and shortness of breath.   Cardiovascular: Negative for chest pain and palpitations.  Gastrointestinal: Positive for blood in stool, heartburn and nausea. Negative for abdominal pain, constipation, diarrhea, melena and vomiting.  Genitourinary: Negative for dysuria and urgency.  Musculoskeletal: Negative for joint pain and myalgias.  Skin: Negative for itching and rash.  Neurological: Negative for dizziness and headaches.  Psychiatric/Behavioral: Negative for depression. The patient is not nervous/anxious.        Objective: BP  137/76   Pulse 81   Temp (!) 97 F (36.1 C)   Ht 5\' 6"  (1.676 m)   Wt 175 lb 6.4 oz (79.6 kg)   BMI 28.31 kg/m  Physical Exam Constitutional:      Appearance: Normal appearance.  HENT:     Head: Normocephalic and atraumatic.  Eyes:     Extraocular Movements: Extraocular movements intact.     Conjunctiva/sclera: Conjunctivae normal.  Cardiovascular:     Rate and Rhythm: Normal rate and regular rhythm.  Pulmonary:     Effort: Pulmonary effort is normal.     Breath sounds: Normal breath sounds.  Abdominal:     General: Bowel sounds are normal.     Palpations: Abdomen is soft.  Musculoskeletal:        General: No swelling. Normal range of motion.     Cervical back: Normal range of motion and neck supple.  Skin:    General: Skin is warm and dry.     Coloration: Skin is not jaundiced.  Neurological:     General: No focal deficit present.     Mental Status: She is alert and oriented to person, place, and time.  Psychiatric:        Mood and Affect: Mood normal.        Behavior: Behavior normal.      Assessment: *Nausea *Dysphagia *LLQ abdominal pain *Diarrhea *Rectal bleeding  Plan: Etiology of patient's upper GI symptoms unclear. Will schedule for EGD to evaluate for peptic ulcer disease, esophagitis, gastritis, H. Pylori, duodenitis, or other. Will also evaluate for esophageal stricture, Schatzki's ring, esophageal web or other.   At the same time we will perform colonoscopy to evaluate her diarrhea and rectal bleeding.  We will perform random colon biopsies to rule out microscopic colitis.  We will also evaluate for diverticulitis, underlying phonatory bowel disease, AVMs, polyps, malignancy, or other  The risks including infection, bleed, or perforation as well as benefits, limitations, alternatives and imponderables have been reviewed with the patient. Potential for esophageal dilation, biopsy, etc. have also been reviewed.  Questions have been answered. All parties  agreeable.  Continue on digestive enzymes.  Continue on Tums as needed for now.  We may need to start her on H2 blocker or PPI pending endoscopic evaluation.  Thank you Dr. Jimmye Norman for the kind referral.  09/24/2020 8:42 AM   Disclaimer: This note was dictated with voice recognition software. Similar sounding words can inadvertently be transcribed and may not be corrected upon review.

## 2020-09-24 NOTE — Progress Notes (Signed)
Primary Care Physician:  Lindale Nation, MD Primary Gastroenterologist:  Dr. Abbey Chatters  Chief Complaint  Patient presents with  . Nausea    With eating, no recent vomiting  . Abdominal Pain    Upper abd, stinging sensation under skin, RUQ/LUQ as well    HPI:   Haley Santana is a 60 y.o. female who presents to the clinic today by referral from her PCP Dr. Jimmye Norman for evaluation.  She has multiple GI complaints for me today.  She states she has chronic nausea primarily after meals though will occasionally happen between meals as well.  She has intermittent acid reflux as well for which she takes chewable Tums.  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  Does note some occasional dysphagia and substernal tightness when she eats.  No odynophagia.  Also notes loose stools up to 5 bowel movements daily.  Is also seeing blood on toilet paper.  No blood in the toilet bowl itself.  Also having left lower quadrant abdominal pain.  Does not radiate.  She states she recently started taking digestive enzymes over-the-counter and this is improving her symptoms.  Last colonoscopy she reports was in 2018 in [place] which she states was WNL.  No family history of colorectal malignancy.  No unintentional weight loss.  No chronic NSAID use.  Past Medical History:  Diagnosis Date  . Arthritis   . Breast cancer ([place]) 02/20/2017   left  . Cancer ([place])   . Diverticulitis   . GERD (gastroesophageal reflux disease)   . H/O benign breast biopsy 1984   right    Past Surgical History:  Procedure Laterality Date  . BREAST LUMPECTOMY    . CHOLECYSTECTOMY    . CYST EXCISION    . HERNIA REPAIR      Current Outpatient Medications  Medication Sig Dispense Refill  . OVER THE COUNTER MEDICATION Support enzymes 3 times per day    . tamoxifen (NOLVADEX) 20 MG tablet TAKE 1 TABLET(20 MG) BY MOUTH DAILY 30 tablet 6   No current facility-administered  medications for this visit.    Allergies as of 09/24/2020 - Review Complete 09/24/2020  Allergen Reaction Noted  . Dust mite extract  07/24/2018  . Latex  07/24/2018  . Other  07/24/2018    Family History  Problem Relation Age of Onset  . Cancer Mother   . Cancer Father   . Cancer Sister   . Diabetes Brother   . Cancer Brother     Social History   Socioeconomic History  . Marital status: Single    Spouse name: Not on file  . Number of children: Not on file  . Years of education: Not on file  . Highest education level: Not on file  Occupational History  . Not on file  Tobacco Use  . Smoking status: Former Research scientist (life sciences)  . Smokeless tobacco: Never Used  Substance and Sexual Activity  . Alcohol use: Yes    Comment: 1-2 times per year  . Drug use: Never  . Sexual activity: Not on file  Other Topics Concern  . Not on file  Social History Narrative  . Not on file   Social Determinants of Health   Financial Resource Strain: Not on file  Food Insecurity: Not on file  Transportation Needs: Not on file  Physical Activity: Not on file  Stress: Not on file  Social Connections: Not on file  Intimate  Partner Violence: Not on file    Subjective: Review of Systems  Constitutional: Negative for chills and fever.  HENT: Negative for congestion and hearing loss.   Eyes: Negative for blurred vision and double vision.  Respiratory: Negative for cough and shortness of breath.   Cardiovascular: Negative for chest pain and palpitations.  Gastrointestinal: Positive for blood in stool, heartburn and nausea. Negative for abdominal pain, constipation, diarrhea, melena and vomiting.  Genitourinary: Negative for dysuria and urgency.  Musculoskeletal: Negative for joint pain and myalgias.  Skin: Negative for itching and rash.  Neurological: Negative for dizziness and headaches.  Psychiatric/Behavioral: Negative for depression. The patient is not nervous/anxious.        Objective: BP  137/76   Pulse 81   Temp (!) 97 F (36.1 C)   Ht 5\' 6"  (1.676 m)   Wt 175 lb 6.4 oz (79.6 kg)   BMI 28.31 kg/m  Physical Exam Constitutional:      Appearance: Normal appearance.  HENT:     Head: Normocephalic and atraumatic.  Eyes:     Extraocular Movements: Extraocular movements intact.     Conjunctiva/sclera: Conjunctivae normal.  Cardiovascular:     Rate and Rhythm: Normal rate and regular rhythm.  Pulmonary:     Effort: Pulmonary effort is normal.     Breath sounds: Normal breath sounds.  Abdominal:     General: Bowel sounds are normal.     Palpations: Abdomen is soft.  Musculoskeletal:        General: No swelling. Normal range of motion.     Cervical back: Normal range of motion and neck supple.  Skin:    General: Skin is warm and dry.     Coloration: Skin is not jaundiced.  Neurological:     General: No focal deficit present.     Mental Status: She is alert and oriented to person, place, and time.  Psychiatric:        Mood and Affect: Mood normal.        Behavior: Behavior normal.      Assessment: *Nausea *Dysphagia *LLQ abdominal pain *Diarrhea *Rectal bleeding  Plan: Etiology of patient's upper GI symptoms unclear. Will schedule for EGD to evaluate for peptic ulcer disease, esophagitis, gastritis, H. Pylori, duodenitis, or other. Will also evaluate for esophageal stricture, Schatzki's ring, esophageal web or other.   At the same time we will perform colonoscopy to evaluate her diarrhea and rectal bleeding.  We will perform random colon biopsies to rule out microscopic colitis.  We will also evaluate for diverticulitis, underlying phonatory bowel disease, AVMs, polyps, malignancy, or other  The risks including infection, bleed, or perforation as well as benefits, limitations, alternatives and imponderables have been reviewed with the patient. Potential for esophageal dilation, biopsy, etc. have also been reviewed.  Questions have been answered. All parties  agreeable.  Continue on digestive enzymes.  Continue on Tums as needed for now.  We may need to start her on H2 blocker or PPI pending endoscopic evaluation.  Thank you Dr. Jimmye Norman for the kind referral.  09/24/2020 8:42 AM   Disclaimer: This note was dictated with voice recognition software. Similar sounding words can inadvertently be transcribed and may not be corrected upon review.

## 2020-09-24 NOTE — Patient Instructions (Signed)
We will schedule you for upper endoscopy and colonoscopy to further evaluate your GI symptoms.  Continue on digestive enzymes.  Continue to take Tums as needed.   Further recommendations to follow  At [facility] we value your feedback. You may receive a survey about your visit today. Please share your experience as we strive to create trusting relationships with our patients to provide genuine, compassionate, quality care.  We appreciate your understanding and patience as we review any laboratory studies, imaging, and other diagnostic tests that are ordered as we care for you. Our office policy is 5 business days for review of these results, and any emergent or urgent results are addressed in a timely manner for your best interest. If you do not hear from our office in 1 week, please contact us.   We also encourage the use of MyChart, which contains your medical information for your review as well. If you are not enrolled in this feature, an access code is on this after visit summary for your convenience. Thank you for allowing [place] to be involved in your care.  It was great to see you today!  I hope you have a great rest of your spring!!    Elon Alas. Abbey Chatters, D.O. Gastroenterology and Hepatology [facility]

## 2020-10-08 DIAGNOSIS — R109 Unspecified abdominal pain: Secondary | ICD-10-CM | POA: Diagnosis not present

## 2020-10-08 DIAGNOSIS — N390 Urinary tract infection, site not specified: Secondary | ICD-10-CM | POA: Diagnosis not present

## 2020-10-14 ENCOUNTER — Other Ambulatory Visit (HOSPITAL_COMMUNITY)
Admission: RE | Admit: 2020-10-14 | Discharge: 2020-10-14 | Disposition: A | Discharge status: Home / Self Care | Payer: BC Managed Care – PPO | Source: Ambulatory Visit | Attending: Internal Medicine | Admitting: Internal Medicine

## 2020-10-14 ENCOUNTER — Other Ambulatory Visit: Payer: Self-pay

## 2020-10-14 DIAGNOSIS — K449 Diaphragmatic hernia without obstruction or gangrene: Secondary | ICD-10-CM | POA: Diagnosis not present

## 2020-10-14 DIAGNOSIS — R1310 Dysphagia, unspecified: Secondary | ICD-10-CM | POA: Diagnosis not present

## 2020-10-14 DIAGNOSIS — Z79810 Long term (current) use of selective estrogen receptor modulators (SERMs): Secondary | ICD-10-CM | POA: Diagnosis not present

## 2020-10-14 DIAGNOSIS — Z79899 Other long term (current) drug therapy: Secondary | ICD-10-CM | POA: Diagnosis not present

## 2020-10-14 DIAGNOSIS — R110 Nausea: Secondary | ICD-10-CM | POA: Diagnosis not present

## 2020-10-14 DIAGNOSIS — K648 Other hemorrhoids: Secondary | ICD-10-CM | POA: Diagnosis not present

## 2020-10-14 DIAGNOSIS — Z20822 Contact with and (suspected) exposure to covid-19: Secondary | ICD-10-CM | POA: Insufficient documentation

## 2020-10-14 DIAGNOSIS — Z01812 Encounter for preprocedural laboratory examination: Secondary | ICD-10-CM | POA: Insufficient documentation

## 2020-10-14 DIAGNOSIS — K529 Noninfective gastroenteritis and colitis, unspecified: Secondary | ICD-10-CM | POA: Diagnosis not present

## 2020-10-14 DIAGNOSIS — Z87891 Personal history of nicotine dependence: Secondary | ICD-10-CM | POA: Diagnosis not present

## 2020-10-14 DIAGNOSIS — K625 Hemorrhage of anus and rectum: Secondary | ICD-10-CM | POA: Diagnosis not present

## 2020-10-14 DIAGNOSIS — K2970 Gastritis, unspecified, without bleeding: Secondary | ICD-10-CM | POA: Diagnosis not present

## 2020-10-14 DIAGNOSIS — K5730 Diverticulosis of large intestine without perforation or abscess without bleeding: Secondary | ICD-10-CM | POA: Diagnosis not present

## 2020-10-14 DIAGNOSIS — Z853 Personal history of malignant neoplasm of breast: Secondary | ICD-10-CM | POA: Diagnosis not present

## 2020-10-14 DIAGNOSIS — K2100 Gastro-esophageal reflux disease with esophagitis, without bleeding: Secondary | ICD-10-CM | POA: Diagnosis not present

## 2020-10-14 LAB — SARS CORONAVIRUS 2 (TAT 6-24 HRS): SARS Coronavirus 2: NEGATIVE

## 2020-10-15 ENCOUNTER — Ambulatory Visit
Admission: EM | Admit: 2020-10-15 | Discharge: 2020-10-15 | Disposition: A | Discharge status: Home / Self Care | Payer: BC Managed Care – PPO | Attending: Emergency Medicine | Admitting: Emergency Medicine

## 2020-10-15 ENCOUNTER — Other Ambulatory Visit: Payer: Self-pay

## 2020-10-15 DIAGNOSIS — R21 Rash and other nonspecific skin eruption: Secondary | ICD-10-CM | POA: Diagnosis not present

## 2020-10-15 DIAGNOSIS — L299 Pruritus, unspecified: Secondary | ICD-10-CM

## 2020-10-15 MED ORDER — PREDNISONE 20 MG PO TABS: 20.0000 mg | ORAL_TABLET | Freq: Two times a day (BID) | ORAL | 0 refills | Status: AC

## 2020-10-15 MED ORDER — HYDROCORTISONE 1 % EX CREA: TOPICAL_CREAM | CUTANEOUS | 0 refills | Status: DC

## 2020-10-15 NOTE — ED Triage Notes (Signed)
Pt presents with rash on mouth that began about a week ago, pt unsure if came from plant from working outside

## 2020-10-15 NOTE — ED Provider Notes (Signed)
[place]   [id number] 10/15/20 Arrival Time: [place]  CC: Rash  SUBJECTIVE:  Haley Santana is a 60 y.o. female who presents with a rash around lips x 1 week.  Was working with plants prior to symptoms, denies touching face and was wear gloves. Otherwise, denies changes in soaps, detergents, close contacts with similar rash, known trigger or environmental trigger, allergy. Denies medications change or starting a new medication recently.  Localizes the rash to around lips.  Describes it as red and itchy.  Has tried OTC medication without relief.  Symptoms are made worse with time.  Denies similar symptoms in the past.   Denies fever, chills, nausea, vomiting, erythema, swelling, discharge, oral lesions, SOB, chest pain, abdominal pain, changes in bowel or bladder function.    ROS: As per HPI.  All other pertinent ROS negative.     Past Medical History:  Diagnosis Date  . Arthritis   . Breast cancer ([place]) 02/20/2017   left  . Cancer ([place])   . Diverticulitis   . GERD (gastroesophageal reflux disease)   . H/O benign breast biopsy 1984   right   Past Surgical History:  Procedure Laterality Date  . BREAST LUMPECTOMY    . CHOLECYSTECTOMY    . CYST EXCISION    . HERNIA REPAIR     Allergies  Allergen Reactions  . Dust Mite Extract Other (See Comments)    Per allergy test  . Other     Tree nuts--throat swelling/develops sores  . Latex Rash and Other (See Comments)    Oral bumps/lesions   No current facility-administered medications on file prior to encounter.   Current Outpatient Medications on File Prior to Encounter  Medication Sig Dispense Refill  . cetirizine (ZYRTEC) 10 MG tablet Take 10 mg by mouth daily as needed for allergies.    . Digestive Enzymes (DIGESTIVE ENZYME PO) Take 1 capsule by mouth in the morning, at noon, and at bedtime.    Marland Kitchen ibuprofen (ADVIL) 200 MG tablet Take 200-400 mg by mouth every 8 (eight) hours as needed (pain).    . Multiple Vitamin  (MULTIVITAMIN WITH MINERALS) TABS tablet Take 1 tablet by mouth 2 (two) times a week. One A Day for Women (In the morning)    . polyethylene glycol-electrolytes (TRILYTE) 420 g solution Take 4,000 mLs by mouth as directed. 4000 mL 0  . tamoxifen (NOLVADEX) 20 MG tablet TAKE 1 TABLET(20 MG) BY MOUTH DAILY (Patient taking differently: Take 20 mg by mouth in the morning.) 30 tablet 6   Social History   Socioeconomic History  . Marital status: Single    Spouse name: Not on file  . Number of children: Not on file  . Years of education: Not on file  . Highest education level: Not on file  Occupational History  . Not on file  Tobacco Use  . Smoking status: Former Research scientist (life sciences)  . Smokeless tobacco: Never Used  Substance and Sexual Activity  . Alcohol use: Yes    Comment: 1-2 times per year  . Drug use: Never  . Sexual activity: Not on file  Other Topics Concern  . Not on file  Social History Narrative  . Not on file   Social Determinants of Health   Financial Resource Strain: Not on file  Food Insecurity: Not on file  Transportation Needs: Not on file  Physical Activity: Not on file  Stress: Not on file  Social Connections: Not on file  Intimate Partner Violence: Not on  file   Family History  Problem Relation Age of Onset  . Cancer Mother   . Cancer Father   . Cancer Sister   . Diabetes Brother   . Cancer Brother     OBJECTIVE: Vitals:   10/15/20 0848  BP: (!) 141/84  Pulse: 75  Resp: 18  Temp: 98 F (36.7 C)  SpO2: 98%    General appearance: alert; no distress Head: NCAT Lungs: normal respiratory effort Extremities: no edema Skin: warm and dry; erythematous macular/ papular rash to perioral space, NTTP, no obvious drainage or bleeding Psychological: alert and cooperative; normal mood and affect  ASSESSMENT & PLAN:  1. Rash and nonspecific skin eruption   2. Itching     Meds ordered this encounter  Medications  . hydrocortisone cream 1 %    Sig: Apply to  affected area 2 times daily    Dispense:  15 g    Refill:  0    Order Specific Question:   Supervising Provider    Answer:   Raylene Everts [[id number]]  . predniSONE (DELTASONE) 20 MG tablet    Sig: Take 1 tablet (20 mg total) by mouth 2 (two) times daily with a meal for 5 days.    Dispense:  10 tablet    Refill:  0    Order Specific Question:   Supervising Provider    Answer:   Raylene Everts [[id number]]   Topical steroid prescribed.  Use as directed Prednisone prescribed.  Fill in 1-2 days if symptoms do not improve with topical steroid Use OTC zyrtec/ allegra/ claritin as needed for itching Follow up with PCP if symptoms persits Return or go to the ER if you have any new or worsening symptoms such as fever, chills, nausea, vomiting, redness, swelling, discharge, if symptoms do not improve with medications, etc...  Reviewed expectations re: course of current medical issues. Questions answered. Outlined signs and symptoms indicating need for more acute intervention. Patient verbalized understanding. After Visit Summary given.   Lestine Box, PA-C 10/15/20 [phone]

## 2020-10-15 NOTE — Discharge Instructions (Signed)
Topical steroid prescribed.  Use as directed Prednisone prescribed.  Fill in 1-2 days if symptoms do not improve with topical steroid Use OTC zyrtec/ allegra/ claritin as needed for itching Follow up with PCP if symptoms persits Return or go to the ER if you have any new or worsening symptoms such as fever, chills, nausea, vomiting, redness, swelling, discharge, if symptoms do not improve with medications, etc..Marland Kitchen

## 2020-10-16 ENCOUNTER — Ambulatory Visit (HOSPITAL_COMMUNITY)
Admission: RE | Admit: 2020-10-16 | Discharge: 2020-10-16 | Disposition: A | Discharge status: Home / Self Care | Payer: BC Managed Care – PPO | Source: Ambulatory Visit | Attending: Internal Medicine | Admitting: Internal Medicine

## 2020-10-16 ENCOUNTER — Ambulatory Visit (HOSPITAL_COMMUNITY): Payer: BC Managed Care – PPO | Admitting: Anesthesiology

## 2020-10-16 ENCOUNTER — Encounter (HOSPITAL_COMMUNITY): Payer: Self-pay

## 2020-10-16 ENCOUNTER — Encounter (HOSPITAL_COMMUNITY)
Admission: RE | Disposition: A | Discharge status: Home / Self Care | Payer: Self-pay | Source: Ambulatory Visit | Attending: Internal Medicine

## 2020-10-16 ENCOUNTER — Other Ambulatory Visit: Payer: Self-pay

## 2020-10-16 DIAGNOSIS — Z20822 Contact with and (suspected) exposure to covid-19: Secondary | ICD-10-CM | POA: Insufficient documentation

## 2020-10-16 DIAGNOSIS — K449 Diaphragmatic hernia without obstruction or gangrene: Secondary | ICD-10-CM | POA: Diagnosis not present

## 2020-10-16 DIAGNOSIS — Z79899 Other long term (current) drug therapy: Secondary | ICD-10-CM | POA: Diagnosis not present

## 2020-10-16 DIAGNOSIS — K648 Other hemorrhoids: Secondary | ICD-10-CM | POA: Insufficient documentation

## 2020-10-16 DIAGNOSIS — D219 Benign neoplasm of connective and other soft tissue, unspecified: Secondary | ICD-10-CM | POA: Diagnosis not present

## 2020-10-16 DIAGNOSIS — R1310 Dysphagia, unspecified: Secondary | ICD-10-CM

## 2020-10-16 DIAGNOSIS — Z853 Personal history of malignant neoplasm of breast: Secondary | ICD-10-CM | POA: Diagnosis not present

## 2020-10-16 DIAGNOSIS — K2100 Gastro-esophageal reflux disease with esophagitis, without bleeding: Secondary | ICD-10-CM

## 2020-10-16 DIAGNOSIS — R110 Nausea: Secondary | ICD-10-CM | POA: Diagnosis not present

## 2020-10-16 DIAGNOSIS — K2970 Gastritis, unspecified, without bleeding: Secondary | ICD-10-CM | POA: Diagnosis not present

## 2020-10-16 DIAGNOSIS — Z79810 Long term (current) use of selective estrogen receptor modulators (SERMs): Secondary | ICD-10-CM | POA: Insufficient documentation

## 2020-10-16 DIAGNOSIS — K219 Gastro-esophageal reflux disease without esophagitis: Secondary | ICD-10-CM | POA: Diagnosis not present

## 2020-10-16 DIAGNOSIS — K5730 Diverticulosis of large intestine without perforation or abscess without bleeding: Secondary | ICD-10-CM | POA: Diagnosis not present

## 2020-10-16 DIAGNOSIS — K529 Noninfective gastroenteritis and colitis, unspecified: Secondary | ICD-10-CM | POA: Diagnosis not present

## 2020-10-16 DIAGNOSIS — Z87891 Personal history of nicotine dependence: Secondary | ICD-10-CM | POA: Insufficient documentation

## 2020-10-16 DIAGNOSIS — K625 Hemorrhage of anus and rectum: Secondary | ICD-10-CM

## 2020-10-16 DIAGNOSIS — R197 Diarrhea, unspecified: Secondary | ICD-10-CM | POA: Diagnosis not present

## 2020-10-16 HISTORY — PX: ESOPHAGOGASTRODUODENOSCOPY (EGD) WITH PROPOFOL: SHX5813

## 2020-10-16 HISTORY — PX: COLONOSCOPY WITH PROPOFOL: SHX5780

## 2020-10-16 HISTORY — PX: BIOPSY: SHX5522

## 2020-10-16 SURGERY — COLONOSCOPY WITH PROPOFOL
Anesthesia: General

## 2020-10-16 MED ORDER — LIDOCAINE HCL (CARDIAC) PF 100 MG/5ML IV SOSY
PREFILLED_SYRINGE | INTRAVENOUS | Status: DC | PRN
  Administered 2020-10-16: 50 mg via INTRAVENOUS

## 2020-10-16 MED ORDER — OMEPRAZOLE 20 MG PO CPDR: 20.0000 mg | DELAYED_RELEASE_CAPSULE | Freq: Two times a day (BID) | ORAL | 5 refills | Status: DC

## 2020-10-16 MED ORDER — STERILE WATER FOR IRRIGATION IR SOLN
Status: DC | PRN
  Administered 2020-10-16: 200 mL

## 2020-10-16 MED ORDER — LACTATED RINGERS IV SOLN: INTRAVENOUS | Status: DC

## 2020-10-16 MED ORDER — PROPOFOL 500 MG/50ML IV EMUL
INTRAVENOUS | Status: DC | PRN
  Administered 2020-10-16: 150 ug/kg/min via INTRAVENOUS

## 2020-10-16 MED ORDER — PROPOFOL 10 MG/ML IV BOLUS
INTRAVENOUS | Status: DC | PRN
  Administered 2020-10-16: 100 mg via INTRAVENOUS

## 2020-10-16 NOTE — Anesthesia Procedure Notes (Signed)
Date/Time: 10/16/2020 1:24 PM Performed by: Orlie Dakin, CRNA Pre-anesthesia Checklist: Patient identified, Emergency Drugs available, Suction available and Patient being monitored Patient Re-evaluated:Patient Re-evaluated prior to induction Oxygen Delivery Method: Nasal cannula Induction Type: IV induction Placement Confirmation: positive ETCO2

## 2020-10-16 NOTE — Anesthesia Preprocedure Evaluation (Signed)
Anesthesia Evaluation  Patient identified by MRN, date of birth, ID band Patient awake    Reviewed: Allergy & Precautions, NPO status , Patient's Chart, lab work & pertinent test results  History of Anesthesia Complications Negative for: history of anesthetic complications  Airway Mallampati: II  TM Distance: >3 FB Neck ROM: Full    Dental  (+) Dental Advisory Given, Caps   Pulmonary former smoker,    Pulmonary exam normal breath sounds clear to auscultation       Cardiovascular Exercise Tolerance: Good Normal cardiovascular exam Rhythm:Regular Rate:Normal     Neuro/Psych negative neurological ROS  negative psych ROS   GI/Hepatic Neg liver ROS, GERD  ,  Endo/Other  negative endocrine ROS  Renal/GU negative Renal ROS     Musculoskeletal  (+) Arthritis ,   Abdominal   Peds  Hematology negative hematology ROS (+)   Anesthesia Other Findings   Reproductive/Obstetrics                             Anesthesia Physical Anesthesia Plan  ASA: II  Anesthesia Plan: General   Post-op Pain Management:    Induction: Intravenous  PONV Risk Score and Plan: Propofol infusion  Airway Management Planned: Nasal Cannula and Natural Airway  Additional Equipment:   Intra-op Plan:   Post-operative Plan:   Informed Consent: I have reviewed the patients History and Physical, chart, labs and discussed the procedure including the risks, benefits and alternatives for the proposed anesthesia with the patient or authorized representative who has indicated his/her understanding and acceptance.     Dental advisory given  Plan Discussed with: CRNA and Surgeon  Anesthesia Plan Comments:         Anesthesia Quick Evaluation

## 2020-10-16 NOTE — Op Note (Addendum)
[facility] Patient Name: Haley Santana Procedure Date: 10/16/2020 1:14 PM MRN: [id number] Date of Birth: Jul 31, 1960 Attending MD: Elon Alas. Abbey Chatters DO CSN: [id number] Age: 60 Admit Type: Outpatient Procedure:                Upper GI endoscopy Indications:              Dysphagia, Nausea Providers:                Elon Alas. Abbey Chatters, DO, Crystal Page Referring MD:              Medicines:                See the Anesthesia note for documentation of the                            administered medications Complications:            No immediate complications. Estimated Blood Loss:     Estimated blood loss was minimal. Procedure:                Pre-Anesthesia Assessment:                           - The anesthesia plan was to use monitored                            anesthesia care (MAC).                           After obtaining informed consent, the endoscope was                            passed under direct vision. Throughout the                            procedure, the patient's blood pressure, pulse, and                            oxygen saturations were monitored continuously. The                            [id number]) was introduced through the mouth,                            and advanced to the second part of duodenum. The                            upper GI endoscopy was accomplished without                            difficulty. The patient tolerated the procedure                            well. Scope In: 1:22:02 PM Scope Out: 1:24:36 PM Total Procedure Duration: 0 hours 2 minutes 34 seconds  Findings:      LA Grade C (one or more mucosal breaks continuous between tops of 2 or  more mucosal folds, less than 75% circumference) esophagitis with no       bleeding was found at the gastroesophageal junction.      Diffuse mild inflammation characterized by erythema was found in the       entire examined stomach. Biopsies were taken with a cold forceps for        Helicobacter pylori testing.      The duodenal bulb, first portion of the duodenum and second portion of       the duodenum were normal.      A small hiatal hernia was present. Impression:               - LA Grade C reflux esophagitis with no bleeding.                           - Gastritis. Biopsied.                           - Normal duodenal bulb, first portion of the                            duodenum and second portion of the duodenum.                           - Small hiatal hernia. Moderate Sedation:      Per Anesthesia Care Recommendation:           - Patient has a contact number available for                            emergencies. The signs and symptoms of potential                            delayed complications were discussed with the                            patient. Return to normal activities tomorrow.                            Written discharge instructions were provided to the                            patient.                           - Resume previous diet.                           - Continue present medications.                           - Await pathology results.                           - Use a proton pump inhibitor PO BID for 8 weeks.                           - No  ibuprofen, naproxen, or other non-steroidal                            anti-inflammatory drugs. Procedure Code(s):        --- Professional ---                           [id number], Esophagogastroduodenoscopy, flexible,                            transoral; with biopsy, single or multiple Diagnosis Code(s):        --- Professional ---                           K21.00, Gastro-esophageal reflux disease with                            esophagitis, without bleeding                           K29.70, Gastritis, unspecified, without bleeding                           R13.10, Dysphagia, unspecified                           R11.0, Nausea CPT copyright 2019 [employer] Medical Association. All rights  reserved. The codes documented in this report are preliminary and upon coder review may  be revised to meet current compliance requirements. Elon Alas. Abbey Chatters, DO [place] Abbey Chatters, DO 10/16/2020 1:27:33 PM This report has been signed electronically. Number of Addenda: 0

## 2020-10-16 NOTE — Op Note (Signed)
[facility] Patient Name: Haley Santana Procedure Date: 10/16/2020 1:26 PM MRN: [id number] Date of Birth: 1961-02-18 Attending MD: Elon Alas. Abbey Chatters DO CSN: [id number] Age: 60 Admit Type: Outpatient Procedure:                Colonoscopy Indications:              Chronic diarrhea, Rectal bleeding Providers:                Elon Alas. Abbey Chatters, DO, Crystal Page, Aram Candela Referring MD:              Medicines:                See the Anesthesia note for documentation of the                            administered medications Complications:            No immediate complications. Estimated Blood Loss:     Estimated blood loss was minimal. Procedure:                Pre-Anesthesia Assessment:                           - The anesthesia plan was to use monitored                            anesthesia care (MAC).                           After obtaining informed consent, the colonoscope                            was passed under direct vision. Throughout the                            procedure, the patient's blood pressure, pulse, and                            oxygen saturations were monitored continuously. The                            [id number]-[id number] ([id number]) scope was introduced through                            the anus and advanced to the the terminal ileum,                            with identification of the appendiceal orifice and                            IC valve. The colonoscopy was performed without                            difficulty. The patient tolerated the procedure                            well. The  quality of the bowel preparation was                            evaluated using the BBPS [facility] Bowel Preparation                            Scale) with scores of: Right Colon = 2 (minor                            amount of residual staining, small fragments of                            stool and/or opaque liquid, but mucosa seen well),                             Transverse Colon = 3 (entire mucosa seen well with                            no residual staining, small fragments of stool or                            opaque liquid) and Left Colon = 3 (entire mucosa                            seen well with no residual staining, small                            fragments of stool or opaque liquid). The total                            BBPS score equals 8. The quality of the bowel                            preparation was good. Scope In: 1:28:13 PM Scope Out: 1:41:48 PM Scope Withdrawal Time: 0 hours 9 minutes 20 seconds  Total Procedure Duration: 0 hours 13 minutes 35 seconds  Findings:      The perianal and digital rectal examinations were normal.      Non-bleeding internal hemorrhoids were found during endoscopy.      Multiple small and large-mouthed diverticula were found in the sigmoid       colon and descending colon.      The terminal ileum appeared normal.      Biopsies for histology were taken with a cold forceps from the ascending       colon, transverse colon and descending colon for evaluation of       microscopic colitis. Impression:               - Non-bleeding internal hemorrhoids.                           - Diverticulosis in the sigmoid colon and in the                            descending colon.                           -  The examined portion of the ileum was normal.                           - Biopsies were taken with a cold forceps from the                            ascending colon, transverse colon and descending                            colon for evaluation of microscopic colitis. Moderate Sedation:      Per Anesthesia Care Recommendation:           - Patient has a contact number available for                            emergencies. The signs and symptoms of potential                            delayed complications were discussed with the                            patient. Return to normal activities tomorrow.                             Written discharge instructions were provided to the                            patient.                           - Resume previous diet.                           - Continue present medications.                           - Await pathology results.                           - Repeat colonoscopy in 10 years for screening                            purposes.                           - Return to GI clinic in 3 months. Procedure Code(s):        --- Professional ---                           [phone], Colonoscopy, flexible; with biopsy, single                            or multiple Diagnosis Code(s):        --- Professional ---                           S34.1,  Other hemorrhoids                           K52.9, Noninfective gastroenteritis and colitis,                            unspecified                           K62.5, Hemorrhage of anus and rectum                           K57.30, Diverticulosis of large intestine without                            perforation or abscess without bleeding CPT copyright 2019 American Medical Association. All rights reserved. The codes documented in this report are preliminary and upon coder review may  be revised to meet current compliance requirements. Elon Alas. Abbey Chatters, DO [name] Abbey Chatters, DO 10/16/2020 1:44:50 PM This report has been signed electronically. Number of Addenda: 0

## 2020-10-16 NOTE — Transfer of Care (Signed)
Immediate Anesthesia Transfer of Care Note  Patient: Val Riles  Procedure(s) Performed: COLONOSCOPY WITH PROPOFOL (N/A ) ESOPHAGOGASTRODUODENOSCOPY (EGD) WITH PROPOFOL (N/A ) BIOPSY  Patient Location: Endoscopy Unit  Anesthesia Type:General  Level of Consciousness: awake, alert  and oriented  Airway & Oxygen Therapy: Patient Spontanous Breathing  Post-op Assessment: Report given to RN and Post -op Vital signs reviewed and stable  Post vital signs: Reviewed and stable  Last Vitals:  Vitals Value Taken Time  BP 140/71 10/16/20 1346  Temp 36.8 C 10/16/20 1346  Pulse    Resp 22 10/16/20 1346  SpO2 98 % 10/16/20 1346    Last Pain:  Vitals:   10/16/20 1346  TempSrc: Oral  PainSc: 0-No pain      Patients Stated Pain Goal: 6 (16/10/96 0454)  Complications: No complications documented.

## 2020-10-16 NOTE — Interval H&P Note (Signed)
History and Physical Interval Note:  10/16/2020 1:10 PM  Haley Santana  has presented today for surgery, with the diagnosis of rectal bleeding, nausea, GERD, diarrhea, dysphagia.  The various methods of treatment have been discussed with the patient and family. After consideration of risks, benefits and other options for treatment, the patient has consented to  Procedure(s) with comments: COLONOSCOPY WITH PROPOFOL (N/A) - PM ESOPHAGOGASTRODUODENOSCOPY (EGD) WITH PROPOFOL (N/A) BALLOON DILATION (N/A) as a surgical intervention.  The patient's history has been reviewed, patient examined, no change in status, stable for surgery.  I have reviewed the patient's chart and labs.  Questions were answered to the patient's satisfaction.     [name]

## 2020-10-16 NOTE — Discharge Instructions (Addendum)
EGD Discharge instructions Please read the instructions outlined below and refer to this sheet in the next few weeks. These discharge instructions provide you with general information on caring for yourself after you leave the hospital. Your doctor may also give you specific instructions. While your treatment has been planned according to the most current medical practices available, unavoidable complications occasionally occur. If you have any problems or questions after discharge, please call your doctor. ACTIVITY  You may resume your regular activity but move at a slower pace for the next 24 hours.   Take frequent rest periods for the next 24 hours.   Walking will help expel (get rid of) the air and reduce the bloated feeling in your abdomen.   No driving for 24 hours (because of the anesthesia (medicine) used during the test).   You may shower.   Do not sign any important legal documents or operate any machinery for 24 hours (because of the anesthesia used during the test).  NUTRITION  Drink plenty of fluids.   You may resume your normal diet.   Begin with a light meal and progress to your normal diet.   Avoid alcoholic beverages for 24 hours or as instructed by your caregiver.  MEDICATIONS  You may resume your normal medications unless your caregiver tells you otherwise.  WHAT YOU CAN EXPECT TODAY  You may experience abdominal discomfort such as a feeling of fullness or "gas" pains.  FOLLOW-UP  Your doctor will discuss the results of your test with you.  SEEK IMMEDIATE MEDICAL ATTENTION IF ANY OF THE FOLLOWING OCCUR:  Excessive nausea (feeling sick to your stomach) and/or vomiting.   Severe abdominal pain and distention (swelling).   Trouble swallowing.   Temperature over 101 F (37.8 C).   Rectal bleeding or vomiting of blood.     Colonoscopy Discharge Instructions  Read the instructions outlined below and refer to this sheet in the next few weeks. These  discharge instructions provide you with general information on caring for yourself after you leave the hospital. Your doctor may also give you specific instructions. While your treatment has been planned according to the most current medical practices available, unavoidable complications occasionally occur.   ACTIVITY  You may resume your regular activity, but move at a slower pace for the next 24 hours.   Take frequent rest periods for the next 24 hours.   Walking will help get rid of the air and reduce the bloated feeling in your belly (abdomen).   No driving for 24 hours (because of the medicine (anesthesia) used during the test).    Do not sign any important legal documents or operate any machinery for 24 hours (because of the anesthesia used during the test).  NUTRITION  Drink plenty of fluids.   You may resume your normal diet as instructed by your doctor.   Begin with a light meal and progress to your normal diet. Heavy or fried foods are harder to digest and may make you feel sick to your stomach (nauseated).   Avoid alcoholic beverages for 24 hours or as instructed.  MEDICATIONS  You may resume your normal medications unless your doctor tells you otherwise.  WHAT YOU CAN EXPECT TODAY  Some feelings of bloating in the abdomen.   Passage of more gas than usual.   Spotting of blood in your stool or on the toilet paper.  IF YOU HAD POLYPS REMOVED DURING THE COLONOSCOPY:  No aspirin products for 7 days or as instructed.  No alcohol for 7 days or as instructed.   Eat a soft diet for the next 24 hours.  FINDING OUT THE RESULTS OF YOUR TEST Not all test results are available during your visit. If your test results are not back during the visit, make an appointment with your caregiver to find out the results. Do not assume everything is normal if you have not heard from your caregiver or the medical facility. It is important for you to follow up on all of your test results.   SEEK IMMEDIATE MEDICAL ATTENTION IF:  You have more than a spotting of blood in your stool.   Your belly is swollen (abdominal distention).   You are nauseated or vomiting.   You have a temperature over 101.   You have abdominal pain or discomfort that is severe or gets worse throughout the day.   Your EGD revealed a large amount of inflammation in your esophagus consistent with reflux esophagitis.  Also mild amount inflammation in her stomach which I took biopsies to rule out infection with a bacteria called H. pylori.  I am going to start you on a new medication called omeprazole 20 mg twice daily.  I want you to take this 30 minutes before breakfast and 30 minutes before dinner.  Avoid NSAIDs as best as you can.  Your colonoscopy was relatively unremarkable.  I did not find any underlying inflammation indicative of Crohn's disease or ulcerative colitis.  I did not find any polyps or evidence of colon cancer.  I would recommend we repeat in 10 years for cancer screening purposes.  I did take numerous biopsies to rule out microscopic colitis which can cause diarrhea.  You do have internal hemorrhoids which is likely causing her bleeding.  Follow-up with GI in 3 to 4 months. Call office on Monday to schedule an appointment in 3 to 4 months [phone].  I hope you have a great rest of your week!  Elon Alas. Abbey Chatters, D.O. Gastroenterology and Hepatology [facility]   Hemorrhoids Hemorrhoids are swollen veins in and around the rectum or anus. There are two types of hemorrhoids:  Internal hemorrhoids. These occur in the veins that are just inside the rectum. They may poke through to the outside and become irritated and painful.  External hemorrhoids. These occur in the veins that are outside the anus and can be felt as a painful swelling or hard lump near the anus. Most hemorrhoids do not cause serious problems, and they can be managed with home treatments  such as diet and lifestyle changes. If home treatments do not help the symptoms, procedures can be done to shrink or remove the hemorrhoids. What are the causes? This condition is caused by increased pressure in the anal area. This pressure may result from various things, including:  Constipation.  Straining to have a bowel movement.  Diarrhea.  Pregnancy.  Obesity.  Sitting for long periods of time.  Heavy lifting or other activity that causes you to strain.  Anal sex.  Riding a bike for a long period of time. What are the signs or symptoms? Symptoms of this condition include:  Pain.  Anal itching or irritation.  Rectal bleeding.  Leakage of stool (feces).  Anal swelling.  One or more lumps around the anus. How is this diagnosed? This condition can often be diagnosed through a visual exam. Other exams or tests may also be done, such as:  An exam that involves feeling the rectal area with a  gloved hand (digital rectal exam).  An exam of the anal canal that is done using a small tube (anoscope).  A blood test, if you have lost a significant amount of blood.  A test to look inside the colon using a flexible tube with a camera on the end (sigmoidoscopy or colonoscopy). How is this treated? This condition can usually be treated at home. However, various procedures may be done if dietary changes, lifestyle changes, and other home treatments do not help your symptoms. These procedures can help make the hemorrhoids smaller or remove them completely. Some of these procedures involve surgery, and others do not. Common procedures include:  Rubber band ligation. Rubber bands are placed at the base of the hemorrhoids to cut off their blood supply.  Sclerotherapy. Medicine is injected into the hemorrhoids to shrink them.  Infrared coagulation. A type of light energy is used to get rid of the hemorrhoids.  Hemorrhoidectomy surgery. The hemorrhoids are surgically removed, and  the veins that supply them are tied off.  Stapled hemorrhoidopexy surgery. The surgeon staples the base of the hemorrhoid to the rectal wall. Follow these instructions at home: Eating and drinking  Eat foods that have a lot of fiber in them, such as whole grains, beans, nuts, fruits, and vegetables.  Ask your health care provider about taking products that have added fiber (fiber supplements).  Reduce the amount of fat in your diet. You can do this by eating low-fat dairy products, eating less red meat, and avoiding processed foods.  Drink enough fluid to keep your urine pale yellow.   Managing pain and swelling  Take warm sitz baths for 20 minutes, 3-4 times a day to ease pain and discomfort. You may do this in a bathtub or using a portable sitz bath that fits over the toilet.  If directed, apply ice to the affected area. Using ice packs between sitz baths may be helpful. ? Put ice in a plastic bag. ? Place a towel between your skin and the bag. ? Leave the ice on for 20 minutes, 2-3 times a day.   General instructions  Take over-the-counter and prescription medicines only as told by your health care provider.  Use medicated creams or suppositories as told.  Get regular exercise. Ask your health care provider how much and what kind of exercise is best for you. In general, you should do moderate exercise for at least 30 minutes on most days of the week (150 minutes each week). This can include activities such as walking, biking, or yoga.  Go to the bathroom when you have the urge to have a bowel movement. Do not wait.  Avoid straining to have bowel movements.  Keep the anal area dry and clean. Use wet toilet paper or moist towelettes after a bowel movement.  Do not sit on the toilet for long periods of time. This increases blood pooling and pain.  Keep all follow-up visits as told by your health care provider. This is important. Contact a health care provider if you  have:  Increasing pain and swelling that are not controlled by treatment or medicine.  Difficulty having a bowel movement, or you are unable to have a bowel movement.  Pain or inflammation outside the area of the hemorrhoids. Get help right away if you have:  Uncontrolled bleeding from your rectum. Summary  Hemorrhoids are swollen veins in and around the rectum or anus.  Most hemorrhoids can be managed with home treatments such as diet and  lifestyle changes.  Taking warm sitz baths can help ease pain and discomfort.  In severe cases, procedures or surgery can be done to shrink or remove the hemorrhoids. This information is not intended to replace advice given to you by your health care provider. Make sure you discuss any questions you have with your health care provider. Document Revised: 10/26/2018 Document Reviewed: 10/19/2017 Elsevier Patient Education  [place].

## 2020-10-16 NOTE — Anesthesia Postprocedure Evaluation (Signed)
Anesthesia Post Note  Patient: Haley Santana  Procedure(s) Performed: COLONOSCOPY WITH PROPOFOL (N/A ) ESOPHAGOGASTRODUODENOSCOPY (EGD) WITH PROPOFOL (N/A ) BIOPSY  Patient location during evaluation: Endoscopy Anesthesia Type: General Level of consciousness: awake and alert and oriented Pain management: pain level controlled Vital Signs Assessment: post-procedure vital signs reviewed and stable Respiratory status: spontaneous breathing and respiratory function stable Cardiovascular status: blood pressure returned to baseline and stable Postop Assessment: no apparent nausea or vomiting Anesthetic complications: no   No complications documented.   Last Vitals:  Vitals:   10/16/20 1227 10/16/20 1346  BP: 133/70 140/71  Pulse: 78   Resp: 19 (!) 22  Temp: 36.8 C 36.8 C  SpO2: 99% 98%    Last Pain:  Vitals:   10/16/20 1346  TempSrc: Oral  PainSc: 0-No pain                 Shuntavia Yerby C Leilene Diprima

## 2020-10-19 LAB — SURGICAL PATHOLOGY

## 2020-10-22 ENCOUNTER — Encounter (HOSPITAL_COMMUNITY): Payer: Self-pay | Admitting: Internal Medicine

## 2020-10-23 ENCOUNTER — Telehealth: Payer: Self-pay

## 2020-10-23 NOTE — Telephone Encounter (Signed)
Pt called EGD results. I advised the pt it will be next week when Dr. Abbey Chatters gets in before I will get her results. Pt was ok with that

## 2020-10-28 NOTE — Telephone Encounter (Signed)
Please let patient know biopsies were negative for H. pylori.  Continue on twice daily PPI.  Her colon biopsies were also negative for microscopic colitis.  Follow-up with GI as previously scheduled.  Thank

## 2020-10-28 NOTE — Telephone Encounter (Signed)
Phoned and advised the pt of both result notes, continuation twice daily PPI medication and to follow up with GI as previously scheduled. Pt agreed

## 2020-11-05 ENCOUNTER — Other Ambulatory Visit (HOSPITAL_COMMUNITY): Payer: Self-pay

## 2020-11-05 DIAGNOSIS — Z170 Estrogen receptor positive status [ER+]: Secondary | ICD-10-CM

## 2020-11-05 DIAGNOSIS — C50412 Malignant neoplasm of upper-outer quadrant of left female breast: Secondary | ICD-10-CM

## 2020-11-05 MED ORDER — TAMOXIFEN CITRATE 20 MG PO TABS
ORAL_TABLET | ORAL | 6 refills | Status: DC
Start: 2020-11-05 — End: 2021-02-02

## 2020-11-23 DIAGNOSIS — K5792 Diverticulitis of intestine, part unspecified, without perforation or abscess without bleeding: Secondary | ICD-10-CM | POA: Diagnosis not present

## 2020-12-11 DIAGNOSIS — Z79891 Long term (current) use of opiate analgesic: Secondary | ICD-10-CM | POA: Diagnosis not present

## 2020-12-11 DIAGNOSIS — M5459 Other low back pain: Secondary | ICD-10-CM | POA: Diagnosis not present

## 2020-12-11 DIAGNOSIS — N390 Urinary tract infection, site not specified: Secondary | ICD-10-CM | POA: Diagnosis not present

## 2020-12-11 DIAGNOSIS — R109 Unspecified abdominal pain: Secondary | ICD-10-CM | POA: Diagnosis not present

## 2020-12-29 ENCOUNTER — Other Ambulatory Visit (HOSPITAL_COMMUNITY): Payer: Self-pay | Admitting: Hematology

## 2020-12-29 DIAGNOSIS — Z170 Estrogen receptor positive status [ER+]: Secondary | ICD-10-CM

## 2020-12-29 DIAGNOSIS — C50412 Malignant neoplasm of upper-outer quadrant of left female breast: Secondary | ICD-10-CM

## 2020-12-29 DIAGNOSIS — Z9889 Other specified postprocedural states: Secondary | ICD-10-CM

## 2021-01-02 DIAGNOSIS — M545 Low back pain, unspecified: Secondary | ICD-10-CM | POA: Diagnosis not present

## 2021-01-14 ENCOUNTER — Ambulatory Visit: Payer: BC Managed Care – PPO | Admitting: Internal Medicine

## 2021-01-20 DIAGNOSIS — M5416 Radiculopathy, lumbar region: Secondary | ICD-10-CM | POA: Diagnosis not present

## 2021-01-25 ENCOUNTER — Other Ambulatory Visit (HOSPITAL_COMMUNITY): Payer: Self-pay

## 2021-01-25 DIAGNOSIS — C50412 Malignant neoplasm of upper-outer quadrant of left female breast: Secondary | ICD-10-CM

## 2021-01-25 DIAGNOSIS — Z170 Estrogen receptor positive status [ER+]: Secondary | ICD-10-CM

## 2021-01-26 ENCOUNTER — Ambulatory Visit (HOSPITAL_COMMUNITY): Payer: BC Managed Care – PPO

## 2021-01-26 ENCOUNTER — Ambulatory Visit (HOSPITAL_COMMUNITY): Payer: BC Managed Care – PPO | Admitting: Nurse Practitioner

## 2021-01-26 ENCOUNTER — Other Ambulatory Visit: Payer: Self-pay

## 2021-01-26 ENCOUNTER — Ambulatory Visit (HOSPITAL_COMMUNITY): Admission: RE | Admit: 2021-01-26 | Payer: BC Managed Care – PPO | Source: Ambulatory Visit

## 2021-01-26 ENCOUNTER — Ambulatory Visit (HOSPITAL_COMMUNITY)
Admission: RE | Admit: 2021-01-26 | Discharge: 2021-01-26 | Disposition: A | Discharge status: Home / Self Care | Payer: BC Managed Care – PPO | Source: Ambulatory Visit | Attending: Hematology | Admitting: Hematology

## 2021-01-26 ENCOUNTER — Inpatient Hospital Stay (HOSPITAL_COMMUNITY): Payer: BC Managed Care – PPO | Attending: Hematology

## 2021-01-26 DIAGNOSIS — I890 Lymphedema, not elsewhere classified: Secondary | ICD-10-CM | POA: Insufficient documentation

## 2021-01-26 DIAGNOSIS — Z170 Estrogen receptor positive status [ER+]: Secondary | ICD-10-CM | POA: Insufficient documentation

## 2021-01-26 DIAGNOSIS — Z79899 Other long term (current) drug therapy: Secondary | ICD-10-CM | POA: Insufficient documentation

## 2021-01-26 DIAGNOSIS — C50412 Malignant neoplasm of upper-outer quadrant of left female breast: Secondary | ICD-10-CM | POA: Diagnosis not present

## 2021-01-26 DIAGNOSIS — Z79810 Long term (current) use of selective estrogen receptor modulators (SERMs): Secondary | ICD-10-CM | POA: Diagnosis not present

## 2021-01-26 DIAGNOSIS — Z923 Personal history of irradiation: Secondary | ICD-10-CM | POA: Insufficient documentation

## 2021-01-26 DIAGNOSIS — Z9889 Other specified postprocedural states: Secondary | ICD-10-CM

## 2021-01-26 DIAGNOSIS — Z853 Personal history of malignant neoplasm of breast: Secondary | ICD-10-CM | POA: Diagnosis not present

## 2021-01-26 DIAGNOSIS — R922 Inconclusive mammogram: Secondary | ICD-10-CM | POA: Diagnosis not present

## 2021-01-26 LAB — VITAMIN B12: Vitamin B-12: 353 pg/mL (ref 180–914)

## 2021-01-26 LAB — CBC WITH DIFFERENTIAL/PLATELET
Abs Immature Granulocytes: 0.01 10*3/uL (ref 0.00–0.07)
Basophils Absolute: 0.1 10*3/uL (ref 0.0–0.1)
Basophils Relative: 1 %
Eosinophils Absolute: 0.4 10*3/uL (ref 0.0–0.5)
Eosinophils Relative: 7 %
HCT: 38.3 % (ref 36.0–46.0)
Hemoglobin: 12.7 g/dL (ref 12.0–15.0)
Immature Granulocytes: 0 %
Lymphocytes Relative: 32 %
Lymphs Abs: 1.9 10*3/uL (ref 0.7–4.0)
MCH: 31.4 pg (ref 26.0–34.0)
MCHC: 33.2 g/dL (ref 30.0–36.0)
MCV: 94.8 fL (ref 80.0–100.0)
Monocytes Absolute: 0.5 10*3/uL (ref 0.1–1.0)
Monocytes Relative: 9 %
Neutro Abs: 2.9 10*3/uL (ref 1.7–7.7)
Neutrophils Relative %: 51 %
Platelets: 239 10*3/uL (ref 150–400)
RBC: 4.04 MIL/uL (ref 3.87–5.11)
RDW: 12.3 % (ref 11.5–15.5)
WBC: 5.8 10*3/uL (ref 4.0–10.5)
nRBC: 0 % (ref 0.0–0.2)

## 2021-01-26 LAB — COMPREHENSIVE METABOLIC PANEL
ALT: 26 U/L (ref 0–44)
AST: 23 U/L (ref 15–41)
Albumin: 3.7 g/dL (ref 3.5–5.0)
Alkaline Phosphatase: 47 U/L (ref 38–126)
Anion gap: 6 (ref 5–15)
BUN: 14 mg/dL (ref 6–20)
CO2: 26 mmol/L (ref 22–32)
Calcium: 8.9 mg/dL (ref 8.9–10.3)
Chloride: 105 mmol/L (ref 98–111)
Creatinine, Ser: 0.91 mg/dL (ref 0.44–1.00)
GFR, Estimated: >60 mL/min (ref >60)
Glucose, Bld: 112 mg/dL — ABNORMAL HIGH (ref 70–99)
Potassium: 3.8 mmol/L (ref 3.5–5.1)
Sodium: 137 mmol/L (ref 135–145)
Total Bilirubin: 0.3 mg/dL (ref 0.3–1.2)
Total Protein: 6.7 g/dL (ref 6.5–8.1)

## 2021-01-26 LAB — FOLATE: Folate: 19.8 ng/mL (ref >5.9)

## 2021-01-26 LAB — VITAMIN D 25 HYDROXY (VIT D DEFICIENCY, FRACTURES): Vit D, 25-Hydroxy: 83.45 ng/mL (ref 30–100)

## 2021-01-26 LAB — LACTATE DEHYDROGENASE: LDH: 160 U/L (ref 98–192)

## 2021-01-28 DIAGNOSIS — N281 Cyst of kidney, acquired: Secondary | ICD-10-CM | POA: Diagnosis not present

## 2021-01-28 DIAGNOSIS — R109 Unspecified abdominal pain: Secondary | ICD-10-CM | POA: Diagnosis not present

## 2021-02-02 ENCOUNTER — Inpatient Hospital Stay (HOSPITAL_BASED_OUTPATIENT_CLINIC_OR_DEPARTMENT_OTHER): Payer: BC Managed Care – PPO | Admitting: Hematology and Oncology

## 2021-02-02 ENCOUNTER — Other Ambulatory Visit: Payer: Self-pay

## 2021-02-02 ENCOUNTER — Other Ambulatory Visit (HOSPITAL_COMMUNITY): Payer: Self-pay | Admitting: Hematology and Oncology

## 2021-02-02 ENCOUNTER — Other Ambulatory Visit: Payer: Self-pay | Admitting: Urology

## 2021-02-02 VITALS — BP 123/60 | HR 99 | Temp 96.8°F | Resp 18 | Wt 167.1 lb

## 2021-02-02 DIAGNOSIS — C50412 Malignant neoplasm of upper-outer quadrant of left female breast: Secondary | ICD-10-CM

## 2021-02-02 DIAGNOSIS — Z170 Estrogen receptor positive status [ER+]: Secondary | ICD-10-CM

## 2021-02-02 DIAGNOSIS — Z79810 Long term (current) use of selective estrogen receptor modulators (SERMs): Secondary | ICD-10-CM | POA: Diagnosis not present

## 2021-02-02 DIAGNOSIS — N281 Cyst of kidney, acquired: Secondary | ICD-10-CM

## 2021-02-02 DIAGNOSIS — Z923 Personal history of irradiation: Secondary | ICD-10-CM | POA: Diagnosis not present

## 2021-02-02 DIAGNOSIS — Z79899 Other long term (current) drug therapy: Secondary | ICD-10-CM | POA: Diagnosis not present

## 2021-02-02 DIAGNOSIS — I890 Lymphedema, not elsewhere classified: Secondary | ICD-10-CM | POA: Diagnosis not present

## 2021-02-02 MED ORDER — TAMOXIFEN CITRATE 20 MG PO TABS: 20.0000 mg | ORAL_TABLET | Freq: Every day | ORAL | 3 refills | Status: DC

## 2021-02-02 NOTE — Progress Notes (Signed)
Patient is taking tamoxifen as prescribed.  They have not missed any doses and report no side effects at this time.

## 2021-02-02 NOTE — Progress Notes (Signed)
Haley Santana Santana, [place] [id number]   CLINIC:  Medical Oncology/Hematology  PCP:  Haley Nation, MD [place] [id number] [phone]   REASON FOR VISIT: Follow-up for breast cancer   CURRENT THERAPY: Tamoxifen  BRIEF ONCOLOGIC HISTORY:  Oncology History  Breast cancer of upper-outer quadrant of left female breast ([place])  08/03/2018 Initial Diagnosis   Breast cancer of upper-outer quadrant of left female breast ([place])   08/03/2018 Cancer Staging   Staging form: Breast, AJCC 8th Edition - Clinical stage from 08/03/2018: Stage IA (cT1a, cN0, cM0, G1, ER+, PR+, HER2-) - Signed by Haley Jack, MD on 08/03/2018     CANCER STAGING: Cancer Staging Breast cancer of upper-outer quadrant of left female breast [facility]) Staging form: Breast, AJCC 8th Edition - Clinical stage from 08/03/2018: Stage IA (cT1a, cN0, cM0, G1, ER+, PR+, HER2-) - Signed by Haley Jack, MD on 08/03/2018    INTERVAL HISTORY:  [name] Santana 60 y.o. female returns for routine follow-up for breast cancer.   Haley Santana Santana reports she is taking her tamoxifen as prescribed.  She does not enjoy taking the medication and reports that it causes her to have a foul taste in her mouth.  She also endorses having hot flashes with no sweats.  She notes that recently she overcame a "miserable infection".  Which was thought to be a urinary tract infection.  She notes that she does have periodic issues with nausea but denies any vomiting or diarrhea.  Her weight has been stable and she has been otherwise at her baseline level of health.  She is willing and able to proceed with tamoxifen therapy at this time.  Full 10 point ROS is listed below.   REVIEW OF SYSTEMS:  Review of Systems  Constitutional:  Positive for fatigue.  Neurological:  Positive for dizziness and headaches.  Psychiatric/Behavioral:  Positive for depression.   All other systems reviewed and are  negative.   PAST MEDICAL/SURGICAL HISTORY:  Past Medical History:  Diagnosis Date   Arthritis    Breast cancer ([place]) 02/20/2017   left   Cancer ([place])    Diverticulitis    GERD (gastroesophageal reflux disease)    H/O benign breast biopsy 1984   right   Past Surgical History:  Procedure Laterality Date   BIOPSY  10/16/2020   Procedure: BIOPSY;  Surgeon: Haley Santana Harman, DO;  [facility];  Service: Endoscopy;;   BREAST LUMPECTOMY     CHOLECYSTECTOMY     COLONOSCOPY WITH PROPOFOL N/A 10/16/2020   Procedure: COLONOSCOPY WITH PROPOFOL;  Surgeon: Haley Santana Harman, DO;  [facility];  Service: Endoscopy;  Laterality: N/A;  PM   CYST EXCISION     ESOPHAGOGASTRODUODENOSCOPY (EGD) WITH PROPOFOL N/A 10/16/2020   Procedure: ESOPHAGOGASTRODUODENOSCOPY (EGD) WITH PROPOFOL;  Surgeon: Haley Santana Harman, DO;  [facility];  Service: Endoscopy;  Laterality: N/A;   HERNIA REPAIR       SOCIAL HISTORY:  Social History   Socioeconomic History   Marital status: Single    Spouse name: Not on file   Number of children: Not on file   Years of education: Not on file   Highest education level: Not on file  Occupational History   Not on file  Tobacco Use   Smoking status: Former   Smokeless tobacco: Never  Vaping Use   Vaping Use: Never used  Substance and Sexual Activity   Alcohol use: Yes  Comment: 1-2 times per year   Drug use: Never   Sexual activity: Not on file  Other Topics Concern   Not on file  Social History Narrative   Not on file   Social Determinants of Health   Financial Resource Strain: Low Risk    Difficulty of Paying Living Expenses: Not hard at all  Food Insecurity: No Food Insecurity   Worried About [place] in the Last Year: Never true   [place] in the Last Year: Never true  Transportation Needs: No Transportation Needs   Lack of Transportation (Medical): No   Lack of Transportation (Non-Medical): No   Physical Activity: Insufficiently Active   Days of Exercise per Week: 5 days   Minutes of Exercise per Session: 20 min  Stress: No Stress Concern Present   Feeling of Stress : Not at all  Social Connections: Socially Isolated   Frequency of Communication with Friends and Family: More than three times a week   Frequency of Social Gatherings with Friends and Family: More than three times a week   Attends Religious Services: Never   Marine scientist or Organizations: No   Attends Music therapist: Never   Marital Status: Divorced  Human resources officer Violence: Not At Risk   Fear of Current or Ex-Partner: No   Emotionally Abused: No   Physically Abused: No   Sexually Abused: No    FAMILY HISTORY:  Family History  Problem Relation Age of Onset   Cancer Mother    Cancer Father    Cancer Sister    Diabetes Brother    Cancer Brother     CURRENT MEDICATIONS:  Outpatient Encounter Medications as of 02/02/2021  Medication Sig Note   cetirizine (ZYRTEC) 10 MG tablet Take 10 mg by mouth daily as needed for allergies.    Digestive Enzymes (DIGESTIVE ENZYME PO) Take 1 capsule by mouth in the morning, at noon, and at bedtime.    Multiple Vitamin (MULTIVITAMIN WITH MINERALS) TABS tablet Take 1 tablet by mouth 2 (two) times a week. One A Day for Women (In the morning)    NUCYNTA 50 MG tablet Take 50 mg by mouth 3 (three) times daily as needed.    omeprazole (PRILOSEC) 20 MG capsule Take 1 capsule (20 mg total) by mouth 2 (two) times daily before a meal. Take 30 min before breakfast and 30 min before dinner    polyethylene glycol-electrolytes (TRILYTE) 420 g solution Take 4,000 mLs by mouth as directed. 10/06/2020: Before colonoscopy    tamoxifen (NOLVADEX) 20 MG tablet TAKE 1 TABLET(20 MG) BY MOUTH DAILY    [DISCONTINUED] hydrocortisone cream 1 % Apply to affected area 2 times daily    [DISCONTINUED] ibuprofen (ADVIL) 200 MG tablet Take 200-400 mg by mouth every 8 (eight) hours  as needed (pain).    No facility-administered encounter medications on file as of 02/02/2021.    ALLERGIES:  Allergies  Allergen Reactions   Dust Mite Extract Other (See Comments)    Per allergy test   Fluoride Preparations Itching    Caused throat to feel itchy   Other     Tree nuts--throat swelling/develops sores   Latex Rash and Other (See Comments)    Oral bumps/lesions     PHYSICAL EXAM:  ECOG Performance status: 1  Vitals:   02/02/21 1112  BP: 123/60  Pulse: 99  Resp: 18  Temp: (!) 96.8 F (36 C)  SpO2: 97%   Filed Weights  02/02/21 1112  Weight: 167 lb 1.6 oz (75.8 kg)   Physical Exam Constitutional:      Appearance: Normal appearance. She is normal weight.  Cardiovascular:     Rate and Rhythm: Normal rate and regular rhythm.     Heart sounds: Normal heart sounds.  Pulmonary:     Effort: Pulmonary effort is normal.     Breath sounds: Normal breath sounds.  Abdominal:     General: Bowel sounds are normal.     Palpations: Abdomen is soft.  Musculoskeletal:        General: Normal range of motion.  Skin:    General: Skin is warm.  Neurological:     Mental Status: She is alert and oriented to person, place, and time. Mental status is at baseline.  Psychiatric:        Mood and Affect: Mood normal.        Behavior: Behavior normal.        Thought Content: Thought content normal.        Judgment: Judgment normal.     LABORATORY DATA:  I have reviewed the labs as listed.  CBC    Component Value Date/Time   WBC 5.8 01/26/2021 1027   RBC 4.04 01/26/2021 1027   HGB 12.7 01/26/2021 1027   HCT 38.3 01/26/2021 1027   PLT 239 01/26/2021 1027   MCV 94.8 01/26/2021 1027   MCH 31.4 01/26/2021 1027   MCHC 33.2 01/26/2021 1027   RDW 12.3 01/26/2021 1027   LYMPHSABS 1.9 01/26/2021 1027   MONOABS 0.5 01/26/2021 1027   EOSABS 0.4 01/26/2021 1027   BASOSABS 0.1 01/26/2021 1027   CMP Latest Ref Rng & Units 01/26/2021 01/21/2020 12/06/2019  Glucose 70 - 99  mg/dL 112(H) 95 89  BUN 6 - 20 mg/dL '14 17 17  ' Creatinine 0.44 - 1.00 mg/dL 0.91 0.90 1.01(H)  Sodium 135 - 145 mmol/L 137 140 138  Potassium 3.5 - 5.1 mmol/L 3.8 4.3 3.9  Chloride 98 - 111 mmol/L 105 106 101  CO2 22 - 32 mmol/L '26 26 27  ' Calcium 8.9 - 10.3 mg/dL 8.9 9.1 8.9  Total Protein 6.5 - 8.1 g/dL 6.7 6.8 7.7  Total Bilirubin 0.3 - 1.2 mg/dL 0.3 0.5 0.4  Alkaline Phos 38 - 126 U/L 47 42 52  AST 15 - 41 U/L '23 17 28  ' ALT 0 - 44 U/L '26 22 26    ' DIAGNOSTIC IMAGING:  I have independently reviewed the mammogram scans and discussed with the patient.  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 -Partial breast radiation therapy dose of 34 Gray at 3.4GY per fraction given twice daily 6 hours apart in 10 fractions completed on 03/24/2017 [facility]. -Tamoxifen started on 03/2017. She is tolerating well -Myriad myrisk-heterozygosity for MUTYH with slightly increased risk for colon cancer. -Colonoscopy in 2019 was reportedly negative. -Bilateral mammogram done 03/20/2020 showed the RADS category 2 benign. -Recommended patient continue on tamoxifen. -Labs done on 01/27/2020 were all WNL. Cr 0.91, CBC WBC 5.8, Hgb 12.7, MCV 94.8, Plt 239 -Physical examination did not reveal any palpable masses. There is mild lymphedema on the left breast. -She will return to clinic in 1 year with repeat labs and mammogram.   2. Bone health: -DEXA scan completed 12/2018  showed normal bone density. -Recommended patient to continue vitamin D and calcium daily.    Orders placed this encounter:  No orders of the defined types were placed in this encounter.  Ledell Peoples, MD Department of Hematology/Oncology [place] [facility] Phone: [phone] Pager: [phone] Email: [email or web address]

## 2021-02-09 ENCOUNTER — Ambulatory Visit (HOSPITAL_COMMUNITY): Payer: BC Managed Care – PPO | Admitting: Nurse Practitioner

## 2021-02-24 ENCOUNTER — Ambulatory Visit
Admission: RE | Admit: 2021-02-24 | Discharge: 2021-02-24 | Disposition: A | Discharge status: Home / Self Care | Payer: BC Managed Care – PPO | Source: Ambulatory Visit | Attending: Urology | Admitting: Urology

## 2021-02-24 ENCOUNTER — Other Ambulatory Visit: Payer: Self-pay

## 2021-02-24 DIAGNOSIS — K760 Fatty (change of) liver, not elsewhere classified: Secondary | ICD-10-CM | POA: Diagnosis not present

## 2021-02-24 DIAGNOSIS — N281 Cyst of kidney, acquired: Secondary | ICD-10-CM

## 2021-02-24 DIAGNOSIS — Z9049 Acquired absence of other specified parts of digestive tract: Secondary | ICD-10-CM | POA: Diagnosis not present

## 2021-02-24 MED ORDER — GADOBENATE DIMEGLUMINE 529 MG/ML IV SOLN
15.0000 mL | Freq: Once | INTRAVENOUS | Status: AC | PRN
  Administered 2021-02-24: 15 mL via INTRAVENOUS

## 2021-03-09 DIAGNOSIS — Z23 Encounter for immunization: Secondary | ICD-10-CM | POA: Diagnosis not present

## 2021-03-09 DIAGNOSIS — K2970 Gastritis, unspecified, without bleeding: Secondary | ICD-10-CM | POA: Diagnosis not present

## 2021-03-09 DIAGNOSIS — R102 Pelvic and perineal pain: Secondary | ICD-10-CM | POA: Diagnosis not present

## 2021-03-10 DIAGNOSIS — N281 Cyst of kidney, acquired: Secondary | ICD-10-CM | POA: Diagnosis not present

## 2021-03-10 DIAGNOSIS — R109 Unspecified abdominal pain: Secondary | ICD-10-CM | POA: Diagnosis not present

## 2021-04-07 DIAGNOSIS — M545 Low back pain, unspecified: Secondary | ICD-10-CM | POA: Diagnosis not present

## 2021-04-07 DIAGNOSIS — Z6827 Body mass index (BMI) 27.0-27.9, adult: Secondary | ICD-10-CM | POA: Diagnosis not present

## 2021-04-07 DIAGNOSIS — M79605 Pain in left leg: Secondary | ICD-10-CM | POA: Diagnosis not present

## 2021-04-07 DIAGNOSIS — G8929 Other chronic pain: Secondary | ICD-10-CM | POA: Diagnosis not present

## 2021-04-21 ENCOUNTER — Ambulatory Visit: Payer: BC Managed Care – PPO | Admitting: Internal Medicine

## 2021-04-22 DIAGNOSIS — L820 Inflamed seborrheic keratosis: Secondary | ICD-10-CM | POA: Diagnosis not present

## 2021-04-22 DIAGNOSIS — R0789 Other chest pain: Secondary | ICD-10-CM | POA: Diagnosis not present

## 2021-04-22 DIAGNOSIS — R5383 Other fatigue: Secondary | ICD-10-CM | POA: Diagnosis not present

## 2021-05-12 ENCOUNTER — Other Ambulatory Visit: Payer: Self-pay | Admitting: Internal Medicine

## 2021-05-12 DIAGNOSIS — R102 Pelvic and perineal pain: Secondary | ICD-10-CM

## 2021-05-18 ENCOUNTER — Ambulatory Visit
Admission: RE | Admit: 2021-05-18 | Discharge: 2021-05-18 | Disposition: A | Discharge status: Home / Self Care | Payer: BC Managed Care – PPO | Source: Ambulatory Visit | Attending: Internal Medicine | Admitting: Internal Medicine

## 2021-05-18 DIAGNOSIS — R102 Pelvic and perineal pain: Secondary | ICD-10-CM

## 2021-05-18 DIAGNOSIS — K5730 Diverticulosis of large intestine without perforation or abscess without bleeding: Secondary | ICD-10-CM | POA: Diagnosis not present

## 2021-05-18 DIAGNOSIS — I700 Atherosclerosis of aorta: Secondary | ICD-10-CM | POA: Diagnosis not present

## 2021-05-18 DIAGNOSIS — K760 Fatty (change of) liver, not elsewhere classified: Secondary | ICD-10-CM | POA: Diagnosis not present

## 2021-05-18 DIAGNOSIS — D259 Leiomyoma of uterus, unspecified: Secondary | ICD-10-CM | POA: Diagnosis not present

## 2021-05-18 MED ORDER — IOPAMIDOL (ISOVUE-300) INJECTION 61%
100.0000 mL | Freq: Once | INTRAVENOUS | Status: AC | PRN
  Administered 2021-05-18: 100 mL via INTRAVENOUS

## 2021-05-18 NOTE — Progress Notes (Signed)
Cardiology Office Note:    Date:  05/18/2021   ID:  Val Riles, DOB November 20, 1960, MRN [id number]  PCP:  [place] Nation, MD   [facility] Providers Cardiologist:  None     Referring MD: Michael Boston, MD   No chief complaint on file. Weakness  History of Present Illness:    Haley Santana is a 60 y.o. female with a hx of Stage 1A, DCIS 0 , Her2-; ER/PR + breast cancer on tamoxifen s/p RT with 34 gray, no cardiac hx, former smoker, referral for fatigue  She notes that when she kneels , it is hard to get up. She feels dizzy and weak. Her legs feel like they are ready to give out. This has been going on for a year. She does an exercise program. She does stretching 20 minutes per day. Back pain limits her. She helps to clean a friends house. She feels a lot of back pain with activity. No shortness of breath. No chest pressure with exertion.  She notes that she woke up in the middle of night a month ago, with left arm felt heavy. The sensation of heaviness went away.   She has no hx of heart disease. She's not had a stress test. No LHC. Her blood pressure is well controlled.  Cardio-onc Breast cancer of upper-outer quadrant of left female breast [facility]) Staging form: Breast, AJCC 8th Edition - Clinical stage from 08/03/2018: Stage IA (cT1a, cN0, cM0, G1, ER+, PR+, HER2-) - Signed by Derek Jack, MD on 08/03/2018 -Left lumpectomy and lymph node biopsy on 01/25/2017 with pathology showing 0.5 cm invasive ductal carcinoma, grade 1, free margins, associated DCIS, 0 out of 1 lymph nodes positive, ER/PR positive, HER-2 negative. -Partial breast radiation therapy dose of 34 Gray at 3.4GY per fraction given twice daily 6 hours apart in 10 fractions completed on 03/24/2017 at [facility]. -Tamoxifen started on 03/2017. She is tolerating well -Myriad myrisk-heterozygosity for MUTYH with slightly increased risk for colon cancer.  No anthracyclines. HER2- no trastuzumab.  No cardiotoxic therapy   Family Hx: discovered her father in 26-Aug-2018, unclear cause of death. Mother had heart disease and cancer.  Social Hx: kids 2 healthy. One in [place] and one here. Lives in [place]  CVD Risk/Equivalent: HLD- TC 177, LDL 118 HTN- no PAD- no DMII no ,  A1c 5.6% Smoker-former, 60 years old until late 84s Premature Family History- no   Past Medical History:  Diagnosis Date   Arthritis    Breast cancer ([place]) 02/20/2017   left   Cancer ([place])    Diverticulitis    GERD (gastroesophageal reflux disease)    H/O benign breast biopsy 08-26-82   right    Past Surgical History:  Procedure Laterality Date   BIOPSY  10/16/2020   Procedure: BIOPSY;  Surgeon: [name], DO;  [facility];  Service: Endoscopy;;   BREAST LUMPECTOMY     CHOLECYSTECTOMY     COLONOSCOPY WITH PROPOFOL N/A 10/16/2020   Procedure: COLONOSCOPY WITH PROPOFOL;  Surgeon: [name], DO;  [facility];  Service: Endoscopy;  Laterality: N/A;  PM   CYST EXCISION     ESOPHAGOGASTRODUODENOSCOPY (EGD) WITH PROPOFOL N/A 10/16/2020   Procedure: ESOPHAGOGASTRODUODENOSCOPY (EGD) WITH PROPOFOL;  Surgeon: [name], DO;  [facility];  Service: Endoscopy;  Laterality: N/A;   HERNIA REPAIR      Current Medications: No outpatient medications have been marked as taking for the  05/19/21 encounter (Appointment) with Janina Mayo, MD.     Allergies:   Dust mite extract, Fluoride preparations, Other, and Latex   Social History   Socioeconomic History   Marital status: Single    Spouse name: Not on file   Number of children: Not on file   Years of education: Not on file   Highest education level: Not on file  Occupational History   Not on file  Tobacco Use   Smoking status: Former   Smokeless tobacco: Never  Vaping Use   Vaping Use: Never used  Substance and Sexual Activity   Alcohol use: Yes    Comment: 1-2 times per year   Drug use: Never   Sexual  activity: Not on file  Other Topics Concern   Not on file  Social History Narrative   Not on file   Social Determinants of Health   Financial Resource Strain: Low Risk    Difficulty of Paying Living Expenses: Not hard at all  Food Insecurity: No Food Insecurity   Worried About [place]ity fundraiser in the Last Year: Never true   [place] in the Last Year: Never true  Transportation Needs: No Transportation Needs   Lack of Transportation (Medical): No   Lack of Transportation (Non-Medical): No  Physical Activity: Insufficiently Active   Days of Exercise per Week: 5 days   Minutes of Exercise per Session: 20 min  Stress: No Stress Concern Present   Feeling of Stress : Not at all  Social Connections: Socially Isolated   Frequency of Communication with Friends and Family: More than three times a week   Frequency of Social Gatherings with Friends and Family: More than three times a week   Attends Religious Services: Never   Marine scientist or Organizations: No   Attends Music therapist: Never   Marital Status: Divorced     Family History: The patient's family history includes Cancer in her brother, father, mother, and sister; Diabetes in her brother.  ROS:   Please see the history of present illness.     All other systems reviewed and are negative.  EKGs/Labs/Other Studies Reviewed:    The following studies were reviewed today:   EKG:  EKG is  ordered today.  The ekg ordered today demonstrates   NSR, no ischemic changes  Recent Labs: 01/26/2021: ALT 26; BUN 14; Creatinine, Ser 0.91; Hemoglobin 12.7; Platelets 239; Potassium 3.8; Sodium 137  Recent Lipid Panel No results found for: CHOL, TRIG, HDL, CHOLHDL, VLDL, LDLCALC, LDLDIRECT   Risk Assessment/Calculations:     ASCVD 3.6%  Physical Exam:    VS:  There were no vitals taken for this visit.    Wt Readings from Last 3 Encounters:  02/02/21 167 lb 1.6 oz (75.8 kg)  10/16/20 174 lb  (78.9 kg)  09/24/20 175 lb 6.4 oz (79.6 kg)     GEN:  Well nourished, well developed in no acute distress HEENT: Normal NECK: No JVD; No carotid bruits LYMPHATICS: No lymphadenopathy CARDIAC: RRR, no murmurs, rubs, gallops RESPIRATORY:  Clear to auscultation without rales, wheezing or rhonchi  ABDOMEN: Soft, non-tender, non-distended MUSCULOSKELETAL:  No edema; No deformity  SKIN: Warm and dry NEUROLOGIC:  Alert and oriented x 3 PSYCHIATRIC:  Normal affect   ASSESSMENT:    #Weakness: she notes dizziness and weakness. No anginal symptoms or dyspnea on exertion. She has no signs or symptoms of heart failure. No high risk for stroke or typical  symptoms.  Her symptoms are fairly vague. Will plan for echo to assess  LV function, wall motion and assess for any valvular dx.  Cardiotoxicity from radiation can present up to 10-20 years later. Risk is increased with Gy >=15 (advances over the last 30 years have reduced the mean heart dose). This risk includes atherosclerosis and valvular fibrosis. Also limitations with surgery of the mediastinum was exposed.  Gari Crown al. Jacc Cardio Onc 2021, Jonette Eva et al. Jacc Cardio Onc 2020  PLAN:    In order of problems listed above:  TTE Follow up 6 months      Medication Adjustments/Labs and Tests Ordered: Current medicines are reviewed at length with the patient today.  Concerns regarding medicines are outlined above.   Signed, Janina Mayo, MD  05/18/2021 1:29 PM    [facility]

## 2021-05-19 ENCOUNTER — Other Ambulatory Visit: Payer: Self-pay

## 2021-05-19 ENCOUNTER — Encounter: Payer: Self-pay | Admitting: Internal Medicine

## 2021-05-19 ENCOUNTER — Ambulatory Visit (INDEPENDENT_AMBULATORY_CARE_PROVIDER_SITE_OTHER): Payer: BC Managed Care – PPO | Admitting: Internal Medicine

## 2021-05-19 VITALS — BP 110/80 | HR 85 | Ht 65.0 in | Wt 167.4 lb

## 2021-05-19 DIAGNOSIS — Z923 Personal history of irradiation: Secondary | ICD-10-CM | POA: Diagnosis not present

## 2021-05-19 NOTE — Patient Instructions (Signed)
Medication Instructions:  ?No Changes In Medications at this time.  ?*If you need a refill on your cardiac medications before your next appointment, please call your pharmacy* ? ?Testing/Procedures: ?Your physician has requested that you have an echocardiogram. Echocardiography is a painless test that uses sound waves to create images of your heart. It provides your doctor with information about the size and shape of your heart and how well your heart?s chambers and valves are working. You may receive an ultrasound enhancing agent through an IV if needed to better visualize your heart during the echo.This procedure takes approximately one hour. There are no restrictions for this procedure. This will take place at the [address], [address].  ? ?Follow-Up: ?At [facility], you and your health needs are our priority.  As part of our continuing mission to provide you with exceptional heart care, we have created designated Provider Care Teams.  These Care Teams include your primary Cardiologist (physician) and Advanced Practice Providers (APPs -  Physician Assistants and Nurse Practitioners) who all work together to provide you with the care you need, when you need it. ? ?Your next appointment:   ?6 month(s) ? ?The format for your next appointment:   ?In Person ? ?Provider:   ?Branch, Mary E, MD   ?

## 2021-06-09 ENCOUNTER — Other Ambulatory Visit: Payer: Self-pay

## 2021-06-09 ENCOUNTER — Ambulatory Visit (HOSPITAL_COMMUNITY): Payer: BC Managed Care – PPO | Attending: Cardiology

## 2021-06-09 DIAGNOSIS — I351 Nonrheumatic aortic (valve) insufficiency: Secondary | ICD-10-CM | POA: Diagnosis not present

## 2021-06-09 DIAGNOSIS — Z923 Personal history of irradiation: Secondary | ICD-10-CM | POA: Diagnosis not present

## 2021-06-09 LAB — ECHOCARDIOGRAM COMPLETE
Area-P 1/2: 4.09 cm2
S' Lateral: 2.8 cm

## 2021-06-16 DIAGNOSIS — C44629 Squamous cell carcinoma of skin of left upper limb, including shoulder: Secondary | ICD-10-CM | POA: Diagnosis not present

## 2021-06-16 DIAGNOSIS — D485 Neoplasm of uncertain behavior of skin: Secondary | ICD-10-CM | POA: Diagnosis not present

## 2021-06-29 DIAGNOSIS — D0462 Carcinoma in situ of skin of left upper limb, including shoulder: Secondary | ICD-10-CM | POA: Diagnosis not present

## 2021-06-29 DIAGNOSIS — L538 Other specified erythematous conditions: Secondary | ICD-10-CM | POA: Diagnosis not present

## 2021-06-29 DIAGNOSIS — Z789 Other specified health status: Secondary | ICD-10-CM | POA: Diagnosis not present

## 2021-06-29 DIAGNOSIS — L820 Inflamed seborrheic keratosis: Secondary | ICD-10-CM | POA: Diagnosis not present

## 2021-06-29 DIAGNOSIS — L298 Other pruritus: Secondary | ICD-10-CM | POA: Diagnosis not present

## 2021-07-20 NOTE — Progress Notes (Signed)
Reviewed at 02/04/21 OV by Tarri Abernethy, PA-C

## 2021-08-03 DIAGNOSIS — L821 Other seborrheic keratosis: Secondary | ICD-10-CM | POA: Diagnosis not present

## 2021-08-23 DIAGNOSIS — R5383 Other fatigue: Secondary | ICD-10-CM | POA: Diagnosis not present

## 2021-08-23 DIAGNOSIS — R1032 Left lower quadrant pain: Secondary | ICD-10-CM | POA: Diagnosis not present

## 2021-09-01 DIAGNOSIS — Z0000 Encounter for general adult medical examination without abnormal findings: Secondary | ICD-10-CM | POA: Diagnosis not present

## 2021-09-01 DIAGNOSIS — R7989 Other specified abnormal findings of blood chemistry: Secondary | ICD-10-CM | POA: Diagnosis not present

## 2021-09-08 DIAGNOSIS — Z1331 Encounter for screening for depression: Secondary | ICD-10-CM | POA: Diagnosis not present

## 2021-09-08 DIAGNOSIS — K2970 Gastritis, unspecified, without bleeding: Secondary | ICD-10-CM | POA: Diagnosis not present

## 2021-09-08 DIAGNOSIS — Z1339 Encounter for screening examination for other mental health and behavioral disorders: Secondary | ICD-10-CM | POA: Diagnosis not present

## 2021-09-08 DIAGNOSIS — Z23 Encounter for immunization: Secondary | ICD-10-CM | POA: Diagnosis not present

## 2021-09-08 DIAGNOSIS — Z0000 Encounter for general adult medical examination without abnormal findings: Secondary | ICD-10-CM | POA: Diagnosis not present

## 2021-09-08 DIAGNOSIS — M79673 Pain in unspecified foot: Secondary | ICD-10-CM | POA: Diagnosis not present

## 2021-10-06 ENCOUNTER — Encounter: Payer: Self-pay | Admitting: Internal Medicine

## 2021-10-06 ENCOUNTER — Ambulatory Visit (INDEPENDENT_AMBULATORY_CARE_PROVIDER_SITE_OTHER): Payer: BC Managed Care – PPO | Admitting: Internal Medicine

## 2021-10-06 VITALS — BP 142/80 | HR 92 | Temp 97.3°F | Ht 65.0 in | Wt 167.4 lb

## 2021-10-06 DIAGNOSIS — R110 Nausea: Secondary | ICD-10-CM

## 2021-10-06 DIAGNOSIS — K219 Gastro-esophageal reflux disease without esophagitis: Secondary | ICD-10-CM | POA: Diagnosis not present

## 2021-10-06 DIAGNOSIS — R1032 Left lower quadrant pain: Secondary | ICD-10-CM

## 2021-10-06 MED ORDER — AMITRIPTYLINE HCL 10 MG PO TABS: 10.0000 mg | ORAL_TABLET | Freq: Every day | ORAL | 5 refills | Status: DC

## 2021-10-06 NOTE — Patient Instructions (Signed)
I am unsure what is causing your chronic pain and multitude of other symptoms. ? ?Possibly this is due to a condition called fibromyalgia.  I would recommend you do some more research into this and see if your symptoms align. ? ?Abdominal pain could be due to visceral hypersensitivity.  I am going to start you on a new medication called amitriptyline 10 mg nightly.  The main side effect of this medication is drowsiness which may help with your sleeping issues.  We have room to increase to 25 mg if needed.  Let us know in 1 to 2 weeks how you are doing. ? ?You do have a uterine fibroid measuring approximately 4 cm.  I am unsure if this is related to your pelvic pain or not.  Given your vaginal bleeding, we may need to consider repeat pelvic ultrasound to further evaluate. ? ?Would recommend fiber therapy given your history of diverticulosis.  Recommend 25 to 35 g of soluble fiber.  If you are able to get this much to your diet great, if not then I would recommend over-the-counter Benefiber. ? ?Agree with at least 6 to 8 glasses of water consumption daily. ? ?For your chronic nausea and history of reflux esophagitis, I would recommend going back on omeprazole 20 mg twice daily for 12 weeks at which point you can decrease back down to once daily. ? ?Follow-up in 2 to 3 months. ? ?It was very nice seeing you again today. ? ?Dr. Abbey Santana ? ?At [facility] we value your feedback. You may receive a survey about your visit today. Please share your experience as we strive to create trusting relationships with our patients to provide genuine, compassionate, quality care. ? ?We appreciate your understanding and patience as we review any laboratory studies, imaging, and other diagnostic tests that are ordered as we care for you. Our office policy is 5 business days for review of these results, and any emergent or urgent results are addressed in a timely manner for your best interest. If you do not hear from our  office in 1 week, please contact us.  ? ?We also encourage the use of MyChart, which contains your medical information for your review as well. If you are not enrolled in this feature, an access code is on this after visit summary for your convenience. Thank you for allowing [place] to be involved in your care. ? ?It was great to see you today!  I hope you have a great rest of your Spring! ? ? ? ?Haley Santana. Haley Santana, D.O. ?Gastroenterology and Hepatology ?[facility] ? ? ? ?

## 2021-10-06 NOTE — Progress Notes (Signed)
? ? ?Referring Provider: Michael Boston, MD ?Primary Care Physician:  Michael Boston, MD ?Primary GI:  Dr. Abbey Chatters ? ?Chief Complaint  ?Patient presents with  ? Abdominal Pain  ?  Pt complains of lt side abd pain for years (lower)  ? ? ?HPI:   ?Haley Santana is a 61 y.o. female who presents to the clinic today for follow-up visit.  Has numerous complaints for me. ? ?Has chronic GERD which she is taking  omeprazole 20 mg daily.  Recently restarted this approximately 1 month ago.  EGD 10/16/2020 showed LA grade C esophagitis and gastritis.  Also small hiatal hernia.  Gastric biopsies benign. ? ?Colonoscopy 10/16/2020 unremarkable besides internal hemorrhoids and diverticulosis.  Random colon biopsies negative. ? ?Notes chronic heartburn and nausea. ? ?Also complaining of left lower quadrant abdominal pain/pelvic pain.  Has a history of 4 cm uterine fibroid.  Does note occasional vaginal bleeding.  Following with gynecology. ? ?Also complaining of abdominal pain left upper quadrant right upper quadrant, back of her neck, left thigh, back of her right shoulder.  She is very frustrated as she has no answers for her chronic symptoms. ? ?Past Medical History:  ?Diagnosis Date  ? Arthritis   ? Breast cancer ([place]) 02/20/2017  ? left  ? Cancer [facility])   ? Diverticulitis   ? GERD (gastroesophageal reflux disease)   ? H/O benign breast biopsy 1984  ? right  ? ? ?Past Surgical History:  ?Procedure Laterality Date  ? BIOPSY  10/16/2020  ? Procedure: BIOPSY;  Surgeon: [name], DO;  [facility];  Service: Endoscopy;;  ? BREAST LUMPECTOMY    ? CHOLECYSTECTOMY    ? COLONOSCOPY WITH PROPOFOL N/A 10/16/2020  ? Procedure: COLONOSCOPY WITH PROPOFOL;  Surgeon: [name], DO;  [facility];  Service: Endoscopy;  Laterality: N/A;  PM  ? CYST EXCISION    ? ESOPHAGOGASTRODUODENOSCOPY (EGD) WITH PROPOFOL N/A 10/16/2020  ? Procedure: ESOPHAGOGASTRODUODENOSCOPY (EGD) WITH PROPOFOL;  Surgeon: [name], DO;   [facility];  Service: Endoscopy;  Laterality: N/A;  ? HERNIA REPAIR    ? ? ?Current Outpatient Medications  ?Medication Sig Dispense Refill  ? amitriptyline (ELAVIL) 10 MG tablet Take 1 tablet (10 mg total) by mouth at bedtime. 30 tablet 5  ? cetirizine (ZYRTEC) 10 MG tablet Take 10 mg by mouth daily as needed for allergies.    ? NUCYNTA 50 MG tablet Take 50 mg by mouth 3 (three) times daily as needed. Takes 1 tablet once every other week.    ? tamoxifen (NOLVADEX) 20 MG tablet Take 1 tablet (20 mg total) by mouth daily. 90 tablet 3  ? Multiple Vitamin (MULTIVITAMIN WITH MINERALS) TABS tablet Take 1 tablet by mouth 2 (two) times a week. One A Day for Women (In the morning) (Patient not taking: Reported on 10/06/2021)    ? Turmeric 400 MG CAPS Take by mouth. (Patient not taking: Reported on 10/06/2021)    ? ?No current facility-administered medications for this visit.  ? ? ?Allergies as of 10/06/2021 - Review Complete 10/06/2021  ?Allergen Reaction Noted  ? Dust mite extract Other (See Comments) 07/24/2018  ? Fluoride preparations Itching 02/02/2021  ? Other  07/24/2018  ? Shellfish allergy Swelling 10/06/2021  ? Latex Rash and Other (See Comments) 07/24/2018  ? ? ?Family History  ?Problem Relation Age of Onset  ? Cancer Mother   ? Cancer Father   ? Cancer Sister   ?  Diabetes Brother   ? Cancer Brother   ? ? ?Social History  ? ?Socioeconomic History  ? Marital status: Single  ?  Spouse name: Not on file  ? Number of children: Not on file  ? Years of education: Not on file  ? Highest education level: Not on file  ?Occupational History  ? Not on file  ?Tobacco Use  ? Smoking status: Former  ? Smokeless tobacco: Never  ?Vaping Use  ? Vaping Use: Never used  ?Substance and Sexual Activity  ? Alcohol use: Yes  ?  Comment: 1-2 times per year  ? Drug use: Never  ? Sexual activity: Not on file  ?Other Topics Concern  ? Not on file  ?Social History Narrative  ? Not on file  ? ?Social Determinants of Health   ? ?Financial Resource Strain: Low Risk   ? Difficulty of Paying Living Expenses: Not hard at all  ?Food Insecurity: No Food Insecurity  ? Worried About Charity fundraiser in the Last Year: Never true  ? Ran Out of Food in the Last Year: Never true  ?Transportation Needs: No Transportation Needs  ? Lack of Transportation (Medical): No  ? Lack of Transportation (Non-Medical): No  ?Physical Activity: Insufficiently Active  ? Days of Exercise per Week: 5 days  ? Minutes of Exercise per Session: 20 min  ?Stress: No Stress Concern Present  ? Feeling of Stress : Not at all  ?Social Connections: Socially Isolated  ? Frequency of Communication with Friends and Family: More than three times a week  ? Frequency of Social Gatherings with Friends and Family: More than three times a week  ? Attends Religious Services: Never  ? Active Member of Clubs or Organizations: No  ? Attends Archivist Meetings: Never  ? Marital Status: Divorced  ? ? ?Subjective: ?Review of Systems  ?Constitutional:  Negative for chills and fever.  ?HENT:  Negative for congestion and hearing loss.   ?Eyes:  Negative for blurred vision and double vision.  ?Respiratory:  Negative for cough and shortness of breath.   ?Cardiovascular:  Negative for chest pain and palpitations.  ?Gastrointestinal:  Positive for abdominal pain, heartburn and nausea. Negative for blood in stool, constipation, diarrhea, melena and vomiting.  ?Genitourinary:  Negative for dysuria and urgency.  ?Musculoskeletal:  Negative for joint pain and myalgias.  ?Skin:  Negative for itching and rash.  ?Neurological:  Negative for dizziness and headaches.  ?Psychiatric/Behavioral:  Negative for depression. The patient is not nervous/anxious.   ? ? ?Objective: ?BP (!) 142/80   Pulse 92   Temp (!) 97.3 ?F (36.3 ?C)   Ht '5\' 5"'$  (1.651 m)   Wt 167 lb 6.4 oz (75.9 kg)   BMI 27.86 kg/m?  ?Physical Exam ?Constitutional:   ?   Appearance: Normal appearance.  ?HENT:  ?   Head:  Normocephalic and atraumatic.  ?Eyes:  ?   Extraocular Movements: Extraocular movements intact.  ?   Conjunctiva/sclera: Conjunctivae normal.  ?Cardiovascular:  ?   Rate and Rhythm: Normal rate and regular rhythm.  ?Pulmonary:  ?   Effort: Pulmonary effort is normal.  ?   Breath sounds: Normal breath sounds.  ?Abdominal:  ?   General: Bowel sounds are normal.  ?   Palpations: Abdomen is soft.  ?Musculoskeletal:     ?   General: No swelling. Normal range of motion.  ?   Cervical back: Normal range of motion and neck supple.  ?Skin: ?  General: Skin is warm and dry.  ?   Coloration: Skin is not jaundiced.  ?Neurological:  ?   General: No focal deficit present.  ?   Mental Status: She is alert and oriented to person, place, and time.  ?Psychiatric:     ?   Mood and Affect: Mood normal.     ?   Behavior: Behavior normal.  ? ? ? ?Assessment: ?*Chronic GERD ?*Chronic nausea ?*Abdominal pain ? ?Plan: ?Discussed patient's symptoms in depth with her today. I am unsure how much of her symptomology is GI related. ? ?For her reflux esophagitis, recommend she go back on omeprazole 20 mg twice daily for 12 weeks at which point she can decrease back down to once daily. ? ?Possible her abdominal pain is related to visceral hypersensitivity.  We will start amitriptyline 10 mg nightly.  Counseled on side effect profile including dry mouth, drowsiness, and she understands. ? ?Recommend 25 to 35 g of soluble fiber.  If not obtained through diet then would start taking over-the-counter Benefiber. ? ?Does have history of 4 cm uterine fibroid as well as increased vaginal bleeding.  May need repeat pelvic ultrasound.  Following with gynecology. ? ?As far as her other pain elsewhere her body, she may have fibromyalgia.  Recommend she do more research in regards to this to see if her symptoms align.  No obvious pressure points on physical exam today however. ? ?Increase water consumption to 6 glasses of water daily. ? ?Follow-up in 2 to 3  months. ? ? ? ?10/08/2021 7:24 AM ? ? ?Disclaimer: This note was dictated with voice recognition software. Similar sounding words can inadvertently be transcribed and may not be corrected upon review. ? ?

## 2021-11-12 ENCOUNTER — Ambulatory Visit: Payer: BC Managed Care – PPO | Admitting: Internal Medicine

## 2021-12-08 ENCOUNTER — Encounter: Payer: Self-pay | Admitting: Internal Medicine

## 2021-12-08 ENCOUNTER — Ambulatory Visit (INDEPENDENT_AMBULATORY_CARE_PROVIDER_SITE_OTHER): Payer: BC Managed Care – PPO | Admitting: Internal Medicine

## 2021-12-08 VITALS — BP 111/75 | HR 71 | Temp 98.3°F | Ht 65.0 in | Wt 165.8 lb

## 2021-12-08 DIAGNOSIS — K219 Gastro-esophageal reflux disease without esophagitis: Secondary | ICD-10-CM | POA: Diagnosis not present

## 2021-12-08 DIAGNOSIS — R1032 Left lower quadrant pain: Secondary | ICD-10-CM | POA: Diagnosis not present

## 2021-12-08 DIAGNOSIS — R110 Nausea: Secondary | ICD-10-CM | POA: Diagnosis not present

## 2021-12-08 MED ORDER — OMEPRAZOLE 20 MG PO CPDR: 20.0000 mg | DELAYED_RELEASE_CAPSULE | Freq: Two times a day (BID) | ORAL | 11 refills | Status: DC

## 2021-12-08 NOTE — Progress Notes (Signed)
Referring Provider: Michael Boston, MD Primary Care Physician:  Michael Boston, MD Primary GI:  Dr. Abbey Chatters  Chief Complaint  Patient presents with   Abdominal Pain    LLQ pain and tingling since 2020. Some nausea. Not as bad as it was. Had some this morning.     HPI:   Haley Santana is a 61 y.o. female who presents to the clinic today for follow-up visit.  Has numerous complaints for me.  Has chronic GERD which she is taking  omeprazole 20 mg twice daily.  EGD 10/16/2020 showed LA grade C esophagitis and gastritis.  Also small hiatal hernia.  Gastric biopsies benign.  Colonoscopy 10/16/2020 unremarkable besides internal hemorrhoids and diverticulosis.  Random colon biopsies negative.  Notes chronic heartburn and nausea.  Also complaining of left lower quadrant abdominal pain/pelvic pain.  Has a history of 4 cm uterine fibroid.  Does note occasional vaginal bleeding.  Following with gynecology.  For her chronic abdominal pain, I started her on amitriptyline 10 mg nightly on previous visit.  She states she cannot handle the side effects including grogginess and dry mouth so she stopped this medication.  Bowels moving well.  Very mild rectal bleeding at time.  Today, overall she states she is feeling better.  Since increasing omeprazole her upper GI symptoms have improved.  She states she has started working out as well which seems to be helping.  She is seeing a new gynecologist next week.  Past Medical History:  Diagnosis Date   Arthritis    Breast cancer ([place]) 02/20/2017   left   Cancer ([place])    Diverticulitis    GERD (gastroesophageal reflux disease)    H/O benign breast biopsy 1984   right    Past Surgical History:  Procedure Laterality Date   BIOPSY  10/16/2020   Procedure: BIOPSY;  Surgeon: [name], DO;  [facility];  Service: Endoscopy;;   BREAST LUMPECTOMY     CHOLECYSTECTOMY     COLONOSCOPY WITH PROPOFOL N/A 10/16/2020   Procedure: COLONOSCOPY  WITH PROPOFOL;  Surgeon: [name], DO;  [facility];  Service: Endoscopy;  Laterality: N/A;  PM   CYST EXCISION     ESOPHAGOGASTRODUODENOSCOPY (EGD) WITH PROPOFOL N/A 10/16/2020   Procedure: ESOPHAGOGASTRODUODENOSCOPY (EGD) WITH PROPOFOL;  Surgeon: [name], DO;  [facility];  Service: Endoscopy;  Laterality: N/A;   HERNIA REPAIR      Current Outpatient Medications  Medication Sig Dispense Refill   OMEPRAZOLE PO Take by mouth. OTC one daily     tamoxifen (NOLVADEX) 20 MG tablet Take 1 tablet (20 mg total) by mouth daily. 90 tablet 3   No current facility-administered medications for this visit.    Allergies as of 12/08/2021 - Review Complete 12/08/2021  Allergen Reaction Noted   Dust mite extract Other (See Comments) 07/24/2018   Fluoride preparations Itching 02/02/2021   Other  07/24/2018   Shellfish allergy Swelling 10/06/2021   Latex Rash and Other (See Comments) 07/24/2018    Family History  Problem Relation Age of Onset   Cancer Mother    Cancer Father    Cancer Sister    Diabetes Brother    Cancer Brother     Social History   Socioeconomic History   Marital status: Single    Spouse name: Not on file   Number of children: Not on file   Years of education: Not on file   Highest  education level: Not on file  Occupational History   Not on file  Tobacco Use   Smoking status: Former    Passive exposure: Past   Smokeless tobacco: Never  Vaping Use   Vaping Use: Never used  Substance and Sexual Activity   Alcohol use: Yes    Comment: 1-2 times per year   Drug use: Never   Sexual activity: Not on file  Other Topics Concern   Not on file  Social History Narrative   Not on file   Social Determinants of Health   Financial Resource Strain: Low Risk  (02/02/2021)   Overall Financial Resource Strain (CARDIA)    Difficulty of Paying Living Expenses: Not hard at all  Food Insecurity: No Food Insecurity (02/02/2021)   Hunger  Vital Sign    Worried About Running Out of Food in the Last Year: Never true    [place] in the Last Year: Never true  Transportation Needs: No Transportation Needs (02/02/2021)   PRAPARE - Hydrologist (Medical): No    Lack of Transportation (Non-Medical): No  Physical Activity: Insufficiently Active (02/02/2021)   Exercise Vital Sign    Days of Exercise per Week: 5 days    Minutes of Exercise per Session: 20 min  Stress: No Stress Concern Present (02/02/2021)   [place]    Feeling of Stress : Not at all  Social Connections: Socially Isolated (02/02/2021)   Social Connection and Isolation Panel [NHANES]    Frequency of Communication with Friends and Family: More than three times a week    Frequency of Social Gatherings with Friends and Family: More than three times a week    Attends Religious Services: Never    Marine scientist or Organizations: No    Attends Archivist Meetings: Never    Marital Status: Divorced    Subjective: Review of Systems  Constitutional:  Negative for chills and fever.  HENT:  Negative for congestion and hearing loss.   Eyes:  Negative for blurred vision and double vision.  Respiratory:  Negative for cough and shortness of breath.   Cardiovascular:  Negative for chest pain and palpitations.  Gastrointestinal:  Positive for abdominal pain, heartburn and nausea. Negative for blood in stool, constipation, diarrhea, melena and vomiting.  Genitourinary:  Negative for dysuria and urgency.  Musculoskeletal:  Negative for joint pain and myalgias.  Skin:  Negative for itching and rash.  Neurological:  Negative for dizziness and headaches.  Psychiatric/Behavioral:  Negative for depression. The patient is not nervous/anxious.      Objective: BP 111/75 (BP Location: Left Arm, Patient Position: Sitting, Cuff Size: Large)   Pulse 71   Temp 98.3 F  (36.8 C) (Oral)   Ht '5\' 5"'$  (1.651 m)   Wt 165 lb 12.8 oz (75.2 kg)   BMI 27.59 kg/m  Physical Exam Constitutional:      Appearance: Normal appearance.  HENT:     Head: Normocephalic and atraumatic.  Eyes:     Extraocular Movements: Extraocular movements intact.     Conjunctiva/sclera: Conjunctivae normal.  Cardiovascular:     Rate and Rhythm: Normal rate and regular rhythm.  Pulmonary:     Effort: Pulmonary effort is normal.     Breath sounds: Normal breath sounds.  Abdominal:     General: Bowel sounds are normal.     Palpations: Abdomen is soft.  Musculoskeletal:  General: No swelling. Normal range of motion.     Cervical back: Normal range of motion and neck supple.  Skin:    General: Skin is warm and dry.     Coloration: Skin is not jaundiced.  Neurological:     General: No focal deficit present.     Mental Status: She is alert and oriented to person, place, and time.  Psychiatric:        Mood and Affect: Mood normal.        Behavior: Behavior normal.      Assessment: *Chronic GERD *Chronic nausea *Abdominal pain  Plan: GERD improved on omeprazole twice daily.  I counseled her that she could try taking this medication once daily and see how she does.  No dysphagia odynophagia.  Does have history of 4 cm uterine fibroid as well as increased vaginal bleeding.  May need repeat pelvic ultrasound.  Following with gynecology.  Patient has started implementing exercise which I congratulated her on.  I think this will be very important going forward for her overall mental and physical health.  Recommend 150 minutes a week of moderate density exercise.  Follow-up in 3 to 4 months.    12/08/2021 10:06 AM   Disclaimer: This note was dictated with voice recognition software. Similar sounding words can inadvertently be transcribed and may not be corrected upon review.

## 2021-12-08 NOTE — Patient Instructions (Signed)
I am happy to hear that you are doing better.  Continue on omeprazole.  I have sent a refill to your pharmacy today.  You can trial this medication once a day and see how you do.  If your symptoms come back then you can take this twice a day.  I think it is fantastic that you have started working out again.  I think exercise will be great for your overall mental and physical health.  Recommend 150 minutes/week of moderate intensity exercise.  It was great seeing you again today.  Follow-up in 3 to 4 months.  Dr. Abbey Chatters

## 2021-12-13 ENCOUNTER — Other Ambulatory Visit (HOSPITAL_COMMUNITY)
Admission: RE | Admit: 2021-12-13 | Discharge: 2021-12-13 | Disposition: A | Discharge status: Home / Self Care | Payer: BC Managed Care – PPO | Source: Ambulatory Visit | Attending: Obstetrics and Gynecology | Admitting: Obstetrics and Gynecology

## 2021-12-13 ENCOUNTER — Ambulatory Visit (INDEPENDENT_AMBULATORY_CARE_PROVIDER_SITE_OTHER): Payer: BC Managed Care – PPO | Admitting: Obstetrics and Gynecology

## 2021-12-13 ENCOUNTER — Encounter: Payer: Self-pay | Admitting: Obstetrics and Gynecology

## 2021-12-13 VITALS — BP 124/82 | HR 88 | Ht 65.25 in | Wt 166.0 lb

## 2021-12-13 DIAGNOSIS — N950 Postmenopausal bleeding: Secondary | ICD-10-CM | POA: Insufficient documentation

## 2021-12-13 DIAGNOSIS — M79609 Pain in unspecified limb: Secondary | ICD-10-CM | POA: Insufficient documentation

## 2021-12-13 DIAGNOSIS — D259 Leiomyoma of uterus, unspecified: Secondary | ICD-10-CM | POA: Diagnosis not present

## 2021-12-13 DIAGNOSIS — Z9229 Personal history of other drug therapy: Secondary | ICD-10-CM

## 2021-12-13 DIAGNOSIS — R102 Pelvic and perineal pain: Secondary | ICD-10-CM

## 2021-12-13 DIAGNOSIS — I700 Atherosclerosis of aorta: Secondary | ICD-10-CM | POA: Insufficient documentation

## 2021-12-13 DIAGNOSIS — R351 Nocturia: Secondary | ICD-10-CM

## 2021-12-13 DIAGNOSIS — F339 Major depressive disorder, recurrent, unspecified: Secondary | ICD-10-CM | POA: Insufficient documentation

## 2021-12-13 DIAGNOSIS — K2970 Gastritis, unspecified, without bleeding: Secondary | ICD-10-CM | POA: Insufficient documentation

## 2021-12-13 DIAGNOSIS — N390 Urinary tract infection, site not specified: Secondary | ICD-10-CM | POA: Diagnosis not present

## 2021-12-13 DIAGNOSIS — C50412 Malignant neoplasm of upper-outer quadrant of left female breast: Secondary | ICD-10-CM | POA: Diagnosis not present

## 2021-12-13 LAB — URINALYSIS, COMPLETE
Bilirubin Urine: NEGATIVE
Glucose, UA: NEGATIVE
Hgb urine dipstick: NEGATIVE
Hyaline Cast: NONE SEEN /LPF
Nitrite: NEGATIVE
Protein, ur: NEGATIVE
RBC / HPF: NONE SEEN /HPF (ref 0–2)
Specific Gravity, Urine: 1.02 (ref 1.001–1.035)
pH: 5.5 (ref 5.0–8.0)

## 2021-12-13 NOTE — Patient Instructions (Signed)

## 2021-12-13 NOTE — Progress Notes (Signed)
GYNECOLOGY  VISIT   HPI: 61 y.o.   Single White or Caucasian Not Hispanic or Latino  female   No obstetric history on file. with No LMP recorded. Patient is postmenopausal.   here for LLQ pains  She c/o a 3 year h/o intermittent pelvic pain, L>R. Worse in the last couple of days. Currently the pain is almost constant. Hurts to roll over. Voiding helps relieve some of the pressure. Has Hx of fibroids and her GI has also noted that one of her fibroids has grown in size.    The pain isn't worsened by pushing on it. The pain is sharp, stinging, deep inside. The pain improves with walking. Pain ranges from a 3-10 in severity.   She has a normal BM qd, normal. Sometimes the pain improves with BM, sometimes it doesn't. She has some blood in her stool (GI is aware). Colonoscopy last year was normal. She had an upper endoscopy and had ulcers.   Some increase in nocturia in the last few days.   Pt reports she had some vaginal bleeding about 3-4 months ago but none since. Has Hx of fibroids and her GI has also noted that one of her fibroids has grown in size.     H/O breast cancer, s/p lumpectomy and radiation in 2018. She is on tamoxifen.   Reviewed her chart. CT 05/18/21 she was noted to have a 4 cm right uterine fibroid.   In 11/21 she had an ultrasound with a 7 mm endometrial stipe. Fibroid was 2 cm.  She had an endometrial biopsy, I can't see the report, but she states it was benign.   She had a double hernia repair as a baby. The pain goes just below the area of her scar.   GYNECOLOGIC HISTORY: No LMP recorded. Patient is postmenopausal. Contraception: PM Menopausal hormone therapy: none        OB History     Gravida  6   Para  2   Term  2   Preterm      AB  4   Living  2      SAB  2   IAB  2   Ectopic      Multiple      Live Births                 Patient Active Problem List   Diagnosis Date Noted   LLQ abdominal pain 12/08/2021   Nausea without vomiting  12/08/2021   Breast cancer of upper-outer quadrant of left female breast ([place]) 08/03/2018    Past Medical History:  Diagnosis Date   Arthritis    Breast cancer ([place]) 02/20/2017   left   Cancer ([place])    Diverticulitis    GERD (gastroesophageal reflux disease)    H/O benign breast biopsy 1984   right    Past Surgical History:  Procedure Laterality Date   BIOPSY  10/16/2020   Procedure: BIOPSY;  Surgeon: [name], DO;  [facility];  Service: Endoscopy;;   BREAST LUMPECTOMY     CHOLECYSTECTOMY     COLONOSCOPY WITH PROPOFOL N/A 10/16/2020   Procedure: COLONOSCOPY WITH PROPOFOL;  Surgeon: [name], DO;  [facility];  Service: Endoscopy;  Laterality: N/A;  PM   CYST EXCISION     ESOPHAGOGASTRODUODENOSCOPY (EGD) WITH PROPOFOL N/A 10/16/2020   Procedure: ESOPHAGOGASTRODUODENOSCOPY (EGD) WITH PROPOFOL;  Surgeon: [name], DO;  [facility];  Service: Endoscopy;  Laterality: N/A;   HERNIA REPAIR      Current Outpatient Medications  Medication Sig Dispense Refill   omeprazole (PRILOSEC) 20 MG capsule Take 1 capsule (20 mg total) by mouth 2 (two) times daily before a meal. OTC one daily 60 capsule 11   tamoxifen (NOLVADEX) 20 MG tablet Take 1 tablet (20 mg total) by mouth daily. 90 tablet 3   No current facility-administered medications for this visit.     ALLERGIES: Banana flavor, Dust mite extract, Fluoride preparations, Other, Shellfish allergy, and Latex  Family History  Problem Relation Age of Onset   Hypertension Mother    Cancer Mother    Cancer Father    Cancer Sister    Diabetes Brother    Cancer Brother    Cancer Maternal Aunt        Ovarian   Breast cancer Maternal Aunt     Social History   Socioeconomic History   Marital status: Single    Spouse name: Not on file   Number of children: Not on file   Years of education: Not on file   Highest education level: Not on file  Occupational History   Not on  file  Tobacco Use   Smoking status: Former    Passive exposure: Past   Smokeless tobacco: Never  Vaping Use   Vaping Use: Never used  Substance and Sexual Activity   Alcohol use: Yes    Comment: 1-2 times per year   Drug use: Never   Sexual activity: Not Currently    Partners: Male    Birth control/protection: Post-menopausal  Other Topics Concern   Not on file  Social History Narrative   Not on file   Social Determinants of Health   Financial Resource Strain: Low Risk  (02/02/2021)   Overall Financial Resource Strain (CARDIA)    Difficulty of Paying Living Expenses: Not hard at all  Food Insecurity: No Food Insecurity (02/02/2021)   Hunger Vital Sign    Worried About Running Out of Food in the Last Year: Never true    [place] in the Last Year: Never true  Transportation Needs: No Transportation Needs (02/02/2021)   PRAPARE - Hydrologist (Medical): No    Lack of Transportation (Non-Medical): No  Physical Activity: Insufficiently Active (02/02/2021)   Exercise Vital Sign    Days of Exercise per Week: 5 days    Minutes of Exercise per Session: 20 min  Stress: No Stress Concern Present (02/02/2021)   [place]    Feeling of Stress : Not at all  Social Connections: Socially Isolated (02/02/2021)   Social Connection and Isolation Panel [NHANES]    Frequency of Communication with Friends and Family: More than three times a week    Frequency of Social Gatherings with Friends and Family: More than three times a week    Attends Religious Services: Never    Marine scientist or Organizations: No    Attends Archivist Meetings: Never    Marital Status: Divorced  Human resources officer Violence: Not At Risk (02/02/2021)   Humiliation, Afraid, Rape, and Kick questionnaire    Fear of Current or Ex-Partner: No    Emotionally Abused: No    Physically Abused: No    Sexually  Abused: No    Review of Systems  All other systems reviewed and are negative.   PHYSICAL EXAMINATION:    BP 124/82  Pulse 88   Ht 5' 5.25" (1.657 m)   Wt 166 lb (75.3 kg)   SpO2 97%   BMI 27.41 kg/m     General appearance: alert, cooperative and appears stated age Neck: no adenopathy, supple, symmetrical, trachea midline and thyroid normal to inspection and palpation Abdomen: soft, non-tender; non distended, no masses,  no organomegaly  Pelvic: External genitalia:  no lesions              Urethra:  normal appearing urethra with no masses, tenderness or lesions              Bartholins and Skenes: normal                 Vagina: normal appearing vagina with normal color and discharge, no lesions              Cervix: no cervical motion tenderness and no lesions              Bimanual Exam:  Uterus:   anteverted, mobile, slightly enlarged, mildly tender              Adnexa: no mass, fullness, tenderness              Rectovaginal: Yes.  .  Confirms.              Anus:  normal sphincter tone, no lesions  Pelvic floor: not tender  Bladder: not tender  The risks of endometrial biopsy were reviewed and a consent was obtained.  A speculum was placed in the vagina and the cervix was cleansed with betadine. A tenaculum was placed on the cervix and the uterine evacuator was placed into the endometrial cavity. The uterus sounded to 8 cm. The endometrial biopsy was performed, taking care to get a representative sample, sampling 360 degrees of the uterine cavity. Minimal tissue was obtained. The tenaculum and speculum were removed. There were no complications.    Chaperone was present for exam.  1. Pelvic pain Etiology of her pain isn't clear. I suspect it may be nerve pain - US PELVIS TRANSVAGINAL NON-OB (TV ONLY); Future  2. Postmenopausal bleeding - Surgical pathology( [place]/ [facility]) - US PELVIS TRANSVAGINAL NON-OB (TV ONLY); Future - Cytology - PAP  3. Uterine leiomyoma,  unspecified location - US PELVIS TRANSVAGINAL NON-OB (TV ONLY); Future  4. History of tamoxifen therapy - Surgical pathology( [place]/ [facility]) - US PELVIS TRANSVAGINAL NON-OB (TV ONLY); Future  5. Nocturia - Urinalysis, Complete - Urine Culture

## 2021-12-16 ENCOUNTER — Ambulatory Visit (INDEPENDENT_AMBULATORY_CARE_PROVIDER_SITE_OTHER): Payer: BC Managed Care – PPO | Admitting: Obstetrics and Gynecology

## 2021-12-16 ENCOUNTER — Ambulatory Visit (INDEPENDENT_AMBULATORY_CARE_PROVIDER_SITE_OTHER): Payer: BC Managed Care – PPO

## 2021-12-16 ENCOUNTER — Encounter: Payer: Self-pay | Admitting: Obstetrics and Gynecology

## 2021-12-16 ENCOUNTER — Other Ambulatory Visit: Payer: Self-pay

## 2021-12-16 VITALS — BP 122/74 | HR 72 | Ht 65.0 in | Wt 166.0 lb

## 2021-12-16 DIAGNOSIS — N950 Postmenopausal bleeding: Secondary | ICD-10-CM

## 2021-12-16 DIAGNOSIS — D259 Leiomyoma of uterus, unspecified: Secondary | ICD-10-CM

## 2021-12-16 DIAGNOSIS — Z9229 Personal history of other drug therapy: Secondary | ICD-10-CM

## 2021-12-16 DIAGNOSIS — R102 Pelvic and perineal pain: Secondary | ICD-10-CM | POA: Diagnosis not present

## 2021-12-16 DIAGNOSIS — N390 Urinary tract infection, site not specified: Secondary | ICD-10-CM

## 2021-12-16 LAB — CYTOLOGY - PAP
Diagnosis: NEGATIVE
Diagnosis: REACTIVE

## 2021-12-16 LAB — URINE CULTURE
MICRO NUMBER:: 13600896
SPECIMEN QUALITY:: ADEQUATE

## 2021-12-16 MED ORDER — SULFAMETHOXAZOLE-TRIMETHOPRIM 800-160 MG PO TABS: 1.0000 | ORAL_TABLET | Freq: Two times a day (BID) | ORAL | 0 refills | Status: AC

## 2021-12-16 NOTE — Progress Notes (Signed)
GYNECOLOGY  VISIT   HPI: 61 y.o.   Single White or Caucasian Not Hispanic or Latino  female   [phone] with No LMP recorded. Haley Santana is postmenopausal.   here for further evaluation of pelvic pain and PMP bleeding. She is on tamoxifen and has a known solitary myoma (4 cm in 12/22).  Endometrial biopsy is pending from 12/13/21.  GYNECOLOGIC HISTORY: No LMP recorded. Haley Santana is postmenopausal. Contraception:PMP Menopausal hormone therapy: No, on tamoxifen        OB History     Gravida  6   Para  2   Term  2   Preterm      AB  4   Living  2      SAB  2   IAB  2   Ectopic      Multiple      Live Births                 Haley Santana Active Problem List   Diagnosis Date Noted   Gastritis 12/13/2021   Hardening of the aorta (main artery of the heart) ([place]) 12/13/2021   Pain in pelvis 12/13/2021   Pain in limb 12/13/2021   Recurrent major depression ([place]) 12/13/2021   LLQ abdominal pain 12/08/2021   Nausea without vomiting 12/08/2021   Breast cancer of upper-outer quadrant of left female breast ([place]) 08/03/2018    Past Medical History:  Diagnosis Date   Arthritis    Breast cancer ([place]) 02/20/2017   left   Cancer ([place])    Diverticulitis    GERD (gastroesophageal reflux disease)    H/O benign breast biopsy 1984   right    Past Surgical History:  Procedure Laterality Date   BIOPSY  10/16/2020   Procedure: BIOPSY;  Surgeon: [name], DO;  [facility];  Service: Endoscopy;;   BREAST LUMPECTOMY     CHOLECYSTECTOMY     COLONOSCOPY WITH PROPOFOL N/A 10/16/2020   Procedure: COLONOSCOPY WITH PROPOFOL;  Surgeon: [name], DO;  [facility];  Service: Endoscopy;  Laterality: N/A;  PM   CYST EXCISION     ESOPHAGOGASTRODUODENOSCOPY (EGD) WITH PROPOFOL N/A 10/16/2020   Procedure: ESOPHAGOGASTRODUODENOSCOPY (EGD) WITH PROPOFOL;  Surgeon: [name], DO;  [facility];  Service: Endoscopy;  Laterality: N/A;   HERNIA  REPAIR      Current Outpatient Medications  Medication Sig Dispense Refill   omeprazole (PRILOSEC) 20 MG capsule Take 1 capsule (20 mg total) by mouth 2 (two) times daily before a meal. OTC one daily 60 capsule 11   sulfamethoxazole-trimethoprim (BACTRIM DS) 800-160 MG tablet Take 1 tablet by mouth 2 (two) times daily for 3 days. 6 tablet 0   tamoxifen (NOLVADEX) 20 MG tablet Take 1 tablet (20 mg total) by mouth daily. 90 tablet 3   No current facility-administered medications for this visit.     ALLERGIES: Banana flavor, Dust mite extract, Fluoride preparations, Other, Shellfish allergy, and Latex  Family History  Problem Relation Age of Onset   Hypertension Mother    Cancer Mother    Cancer Father    Cancer Sister    Diabetes Brother    Cancer Brother    Cancer Maternal Aunt        Ovarian   Breast cancer Maternal Aunt     Social History   Socioeconomic History   Marital status: Single    Spouse name: Not on file   Number of children: Not on  file   Years of education: Not on file   Highest education level: Not on file  Occupational History   Not on file  Tobacco Use   Smoking status: Former    Passive exposure: Past   Smokeless tobacco: Never  Vaping Use   Vaping Use: Never used  Substance and Sexual Activity   Alcohol use: Yes    Comment: 1-2 times per year   Drug use: Never   Sexual activity: Not Currently    Partners: Male    Birth control/protection: Post-menopausal  Other Topics Concern   Not on file  Social History Narrative   Not on file   Social Determinants of Health   Financial Resource Strain: Low Risk  (02/02/2021)   Overall Financial Resource Strain (CARDIA)    Difficulty of Paying Living Expenses: Not hard at all  Food Insecurity: No Food Insecurity (02/02/2021)   Hunger Vital Sign    Worried About Running Out of Food in the Last Year: Never true    [place] in the Last Year: Never true  Transportation Needs: No Transportation Needs  (02/02/2021)   PRAPARE - Hydrologist (Medical): No    Lack of Transportation (Non-Medical): No  Physical Activity: Insufficiently Active (02/02/2021)   Exercise Vital Sign    Days of Exercise per Week: 5 days    Minutes of Exercise per Session: 20 min  Stress: No Stress Concern Present (02/02/2021)   [place]    Feeling of Stress : Not at all  Social Connections: Socially Isolated (02/02/2021)   Social Connection and Isolation Panel [NHANES]    Frequency of Communication with Friends and Family: More than three times a week    Frequency of Social Gatherings with Friends and Family: More than three times a week    Attends Religious Services: Never    Marine scientist or Organizations: No    Attends Archivist Meetings: Never    Marital Status: Divorced  Human resources officer Violence: Not At Risk (02/02/2021)   Humiliation, Afraid, Rape, and Kick questionnaire    Fear of Current or Ex-Partner: No    Emotionally Abused: No    Physically Abused: No    Sexually Abused: No    ROS  PHYSICAL EXAMINATION:    There were no vitals taken for this visit.    General appearance: alert, cooperative and appears stated age Neck: no adenopathy, supple, symmetrical, trachea midline and thyroid normal to inspection and palpation Heart: regular rate and rhythm Lungs: CTAB Abdomen: soft, non-tender; bowel sounds normal; no masses,  no organomegaly Extremities: normal, atraumatic, no cyanosis Skin: normal color, texture and turgor, no rashes or lesions Lymph: normal cervical supraclavicular and inguinal nodes Neurologic: grossly normal   Pelvic ultrasound  Indications: Pelvic pain, PMP bleeding  Findings:  Uterus 6.52 x 8.07 x 3.65 cm Fibroids: 1) 4.37 x 4.08 cm, subserosal (increased from 3.2 x 2.8 cm in 3/20) 2) 2.31 x 2.25 cm, subserosal (new)  Endometrium 3.85 mm  Left ovary  2.09 x 1.36 x 1.25 cm  Right ovary 2.47 x 1.45 x 1.26 cm  No free fluid  Impression:  Anteverted, enlarged uterus 2 subserosal fibroids toward the right side. Largest is 4.4 x 4.2 cm Asymmetrically thickened endometrium 3 D imaging suggestive of a 1.3 cm intracavity mass with a feeder vessel Atrophic ovaries bilaterally  1. Postmenopausal bleeding U/S with suspected endometrial polyp. Endometrial biopsy  pending. Plan: hysteroscopy, dilation and curettage. Reviewed risks, including: bleeding, infection, uterine perforation, fluid overload, need for further sugery  2. History of tamoxifen therapy  3. Uterine leiomyoma, unspecified location

## 2021-12-20 ENCOUNTER — Telehealth: Payer: Self-pay

## 2021-12-20 ENCOUNTER — Encounter: Payer: Self-pay | Admitting: Obstetrics and Gynecology

## 2021-12-20 ENCOUNTER — Telehealth: Payer: Self-pay | Admitting: Obstetrics and Gynecology

## 2021-12-20 NOTE — Telephone Encounter (Signed)
Called patient and per DPR access note on file I left detailed message I voice mail that Dr. Talbert Nan asked me to check with pathology on her biopsy result as she had not yet received the result. I was advised that the pathologist it was assigned to is off this week and had left it as an oversight. It had been reassigned to a different pathologist and we will have the result this week and let her know about the results as soon as we can.

## 2021-12-20 NOTE — Telephone Encounter (Signed)
Surgery: CPT [phone] - Hysteroscopy/D&C/Myosure,   Diagnosis: N95.0 Postmenopausal Bleeding,,  Location: [place]  Status: Outpatient  Time: 30 Minutes  Assistant: N/A  Urgency: At Patient's Convenience  Pre-Op Appointment: Completed  Post-Op Appointment(s): 1 Week,   Time Out Of Work: Day Of Surgery, 1 Day Post Op

## 2021-12-21 LAB — SURGICAL PATHOLOGY

## 2021-12-21 NOTE — Telephone Encounter (Signed)
Reviewed with Dr. Talbert Nan, ok to schedule at [facility].   Spoke with patient. Reviewed surgery dates. Patient request to proceed with surgery on 01/10/22.  Advised patient I will forward to business office for return call. I will return call once surgery date and time confirmed. Patient verbalizes understanding and is agreeable.   Surgery request sent.   Routing to [employer]

## 2021-12-23 ENCOUNTER — Other Ambulatory Visit: Payer: BC Managed Care – PPO

## 2021-12-23 ENCOUNTER — Other Ambulatory Visit: Payer: BC Managed Care – PPO | Admitting: Obstetrics and Gynecology

## 2021-12-24 NOTE — Telephone Encounter (Signed)
Spoke with patient. Surgery date request confirmed.  Advised surgery is scheduled for 01/10/22, [place].  Surgery instruction sheet and hospital brochure reviewed, printed copy will be mailed.  Patient verbalizes understanding and is agreeable.   Encounter may be closed once benefits reviewed.

## 2021-12-24 NOTE — Telephone Encounter (Signed)
Patient returned call. States she was planning surgery for 02/10/22. Advised surgery not available on 8/31, offered alternative dates. Patient is going to review 02/07/22 with her daughter and return call to confirm.

## 2021-12-25 IMAGING — MG DIGITAL DIAGNOSTIC BILAT W/ TOMO W/ CAD
7 series · 8 of 19 positions shown · non-contrast
Comparison: Previous exam(s).

CLINICAL DATA: 59-year-old female for annual follow-up. History of
LEFT breast cancer and lumpectomy in 4748.

EXAM:
DIGITAL DIAGNOSTIC BILATERAL MAMMOGRAM WITH CAD AND TOMO

[L CC]
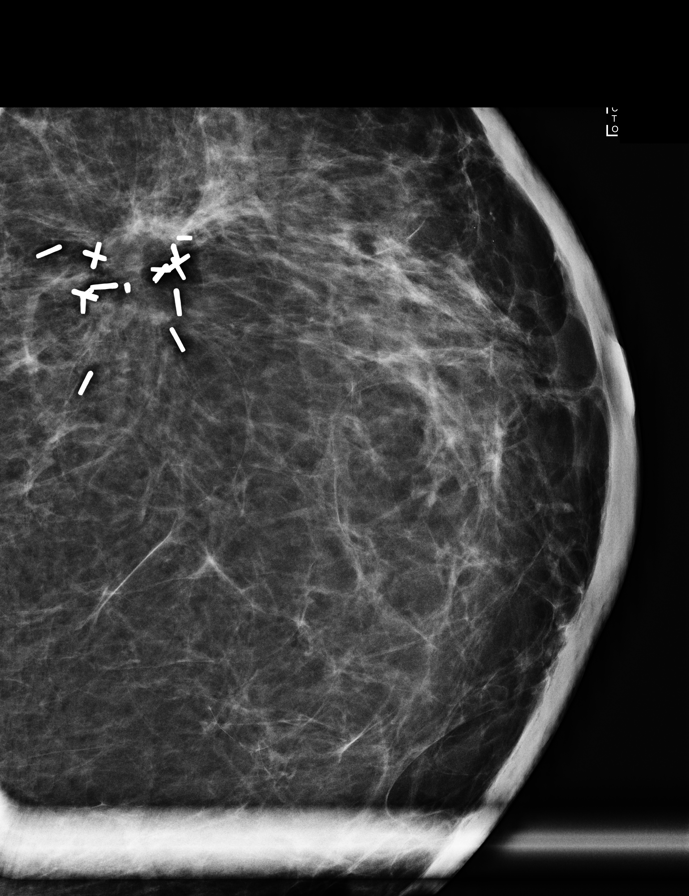

[R CC synth-2D]
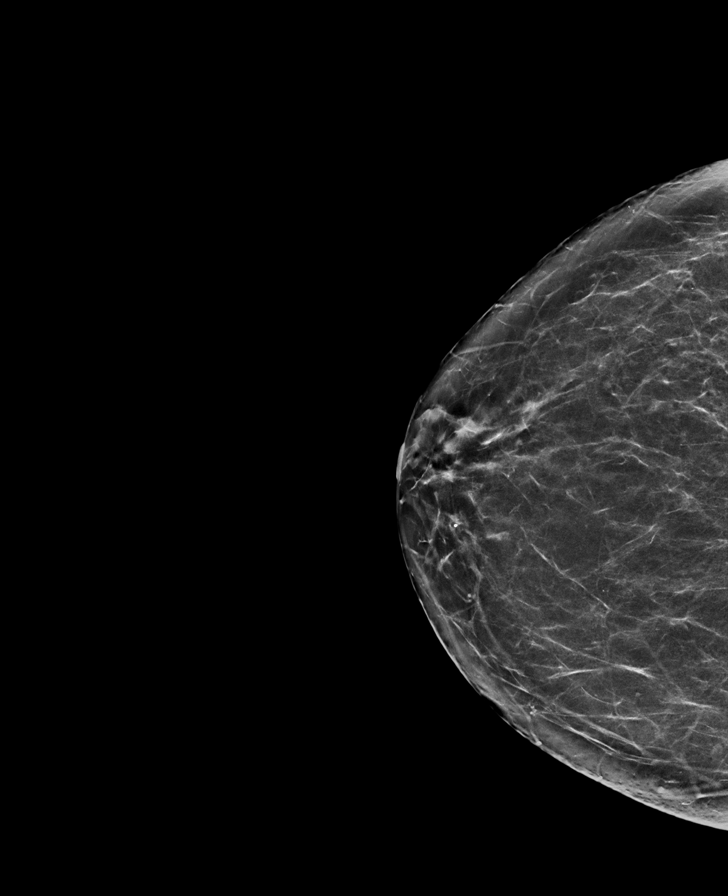

[L CC synth-2D]
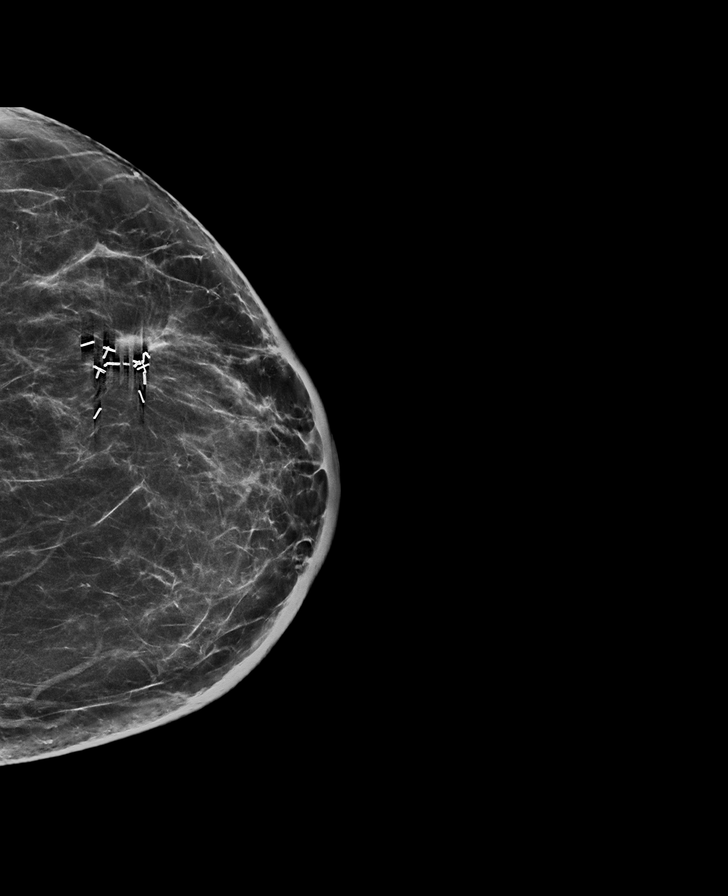

[L MLO synth-2D]
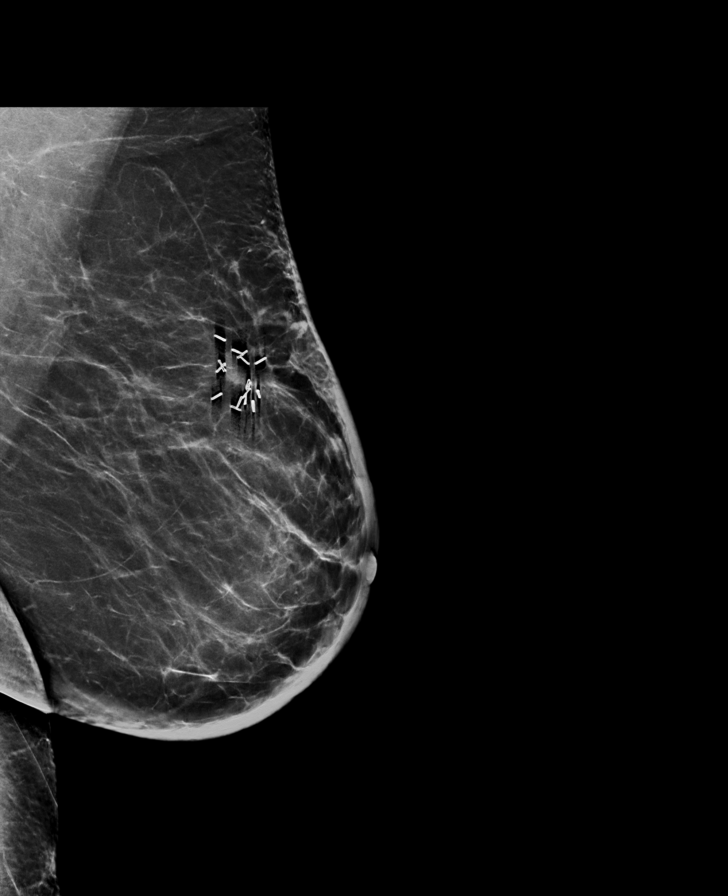

[R CC tomo · 2 of 67 frames shown]
[frame 22/67]
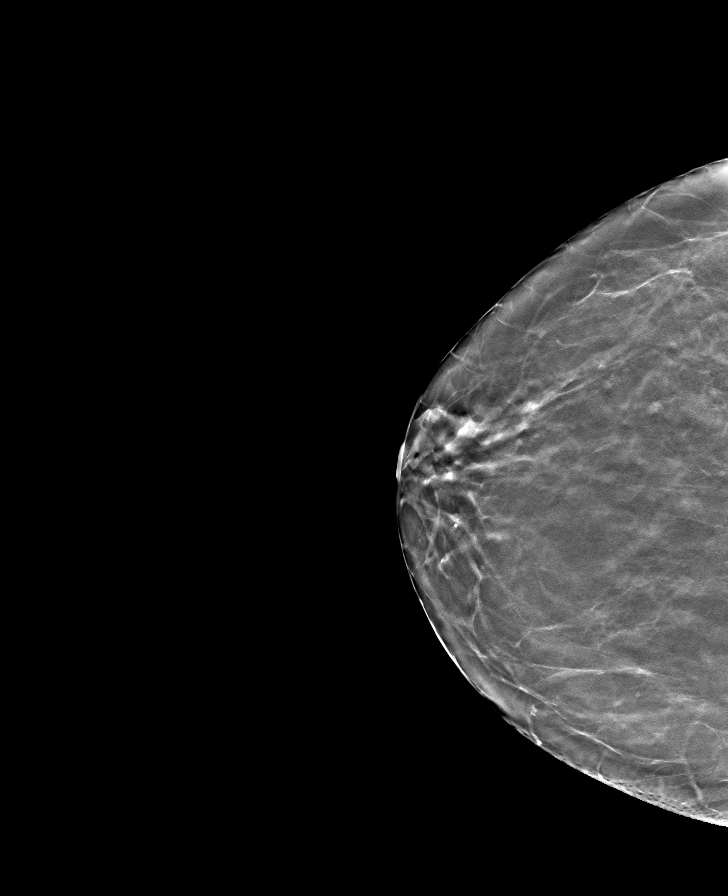
[frame 34/67]
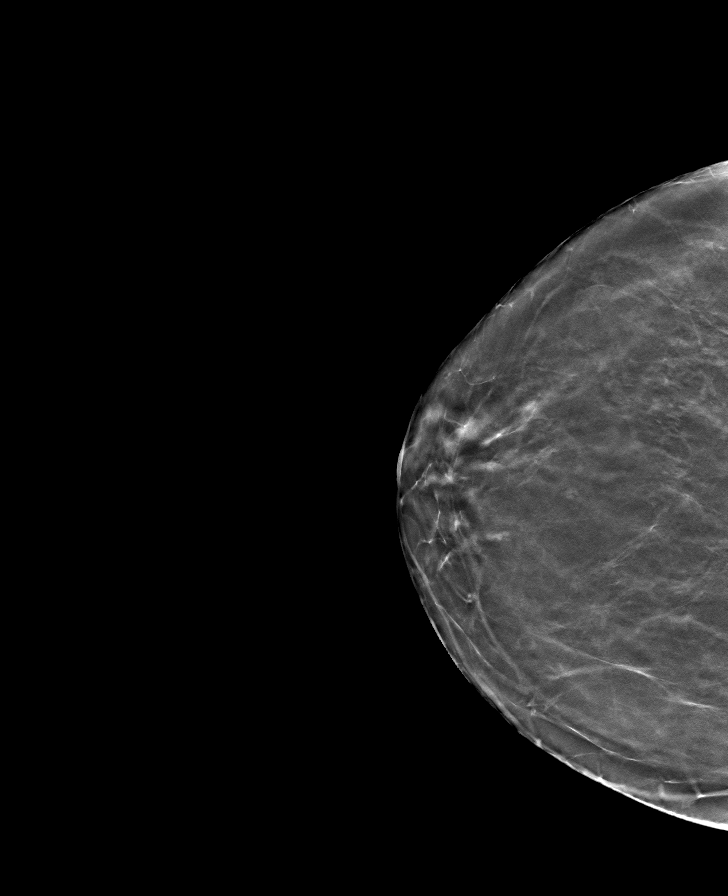

[R MLO tomo · tomo slice 39/78.0]
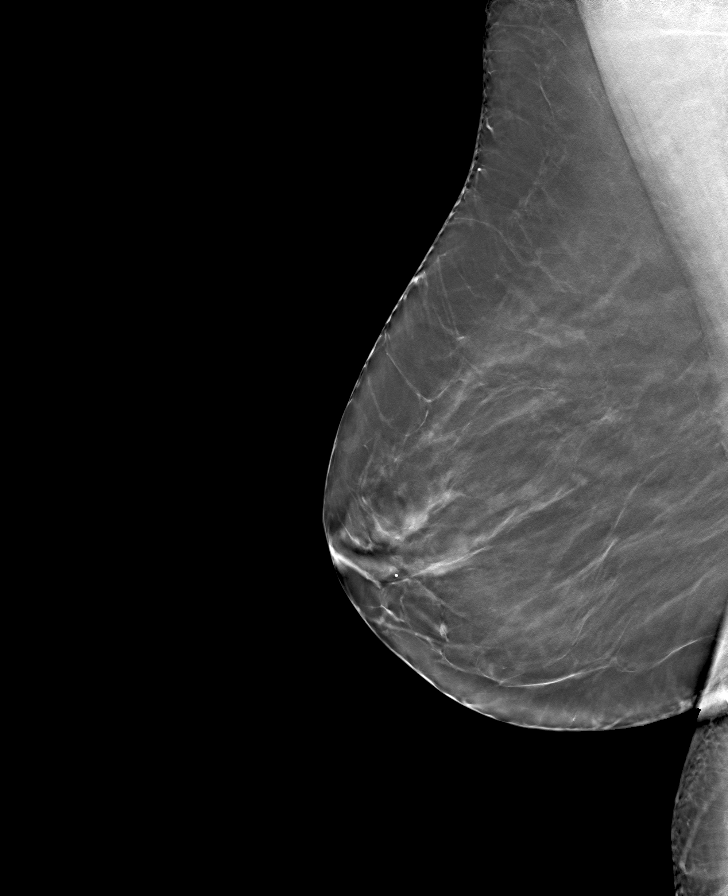

[L MLO tomo · tomo slice 43/86.0]
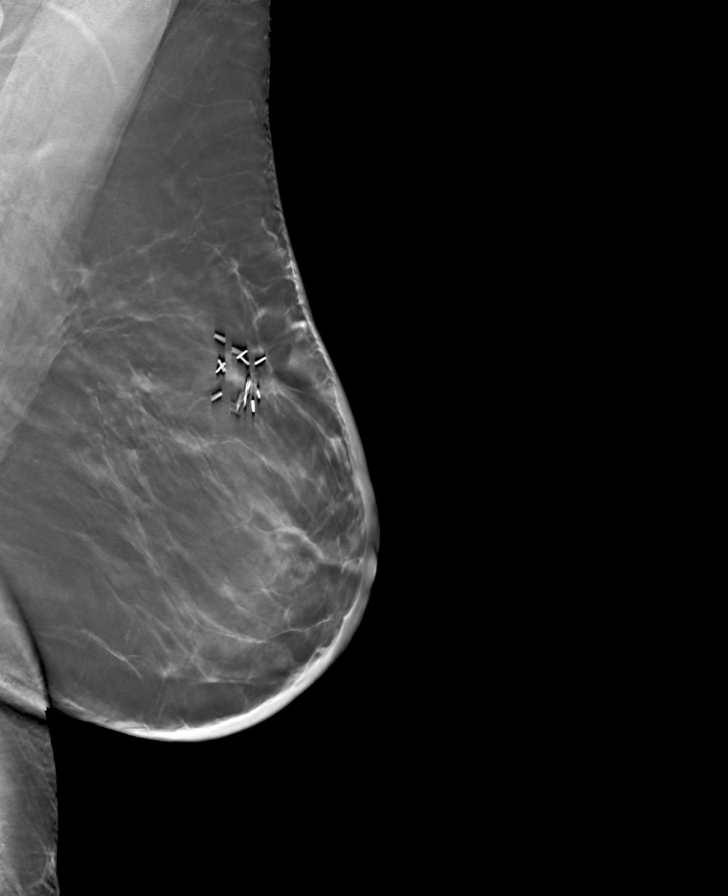

[8 of 19 positions shown; findings below may reference images not displayed]

ACR Breast Density Category b: There are scattered areas of
fibroglandular density.
FINDINGS: 2D and 3D full field views of both breasts and a magnification view
of the lumpectomy site demonstrate no suspicious mass, nonsurgical
distortion or worrisome calcifications.

LEFT lumpectomy changes are again noted.

Mammographic images were processed with CAD.
IMPRESSION: No evidence of breast malignancy.

RECOMMENDATION:
Bilateral diagnostic mammogram in 1 year.

I have discussed the findings and recommendations with the patient.
If applicable, a reminder letter will be sent to the patient
regarding the next appointment.

BI-RADS CATEGORY  2: Benign.

## 2021-12-27 NOTE — Telephone Encounter (Signed)
Spoke with patient. Surgery date request confirmed.  Advised surgery is scheduled for 02/07/22, [facility] at 6.  Surgery instruction sheet reviewed, printed copy will be mailed.  Patient verbalizes understanding and is agreeable.    Encounter may be closed once benefits reviewed.

## 2021-12-28 ENCOUNTER — Other Ambulatory Visit (HOSPITAL_COMMUNITY): Payer: Self-pay | Admitting: Hematology and Oncology

## 2021-12-28 DIAGNOSIS — Z9889 Other specified postprocedural states: Secondary | ICD-10-CM

## 2021-12-28 NOTE — Telephone Encounter (Signed)
Spoke with patient regarding surgery benefits. Patient acknowledges understanding of information presented. Patient is aware that benefits presented are professional benefits only. Patient is aware the hospital will call with facility benefits. See account note.This insurance quote is not a guarantee of benefits or claims & is subject to change upon insurance company review. Message sent to front desk for surgery pre-payment

## 2021-12-29 ENCOUNTER — Telehealth: Payer: Self-pay | Admitting: *Deleted

## 2021-12-29 MED ORDER — SULFAMETHOXAZOLE-TRIMETHOPRIM 800-160 MG PO TABS: 1.0000 | ORAL_TABLET | Freq: Two times a day (BID) | ORAL | 0 refills | Status: DC

## 2021-12-29 NOTE — Telephone Encounter (Signed)
Patient was prescribed Bactrim bid x 3 days on 12/16/21, reports she felt better while taking Rx. Reports she still have pressure and getting up going to bathroom twice a night instead of once like she normally does. No other symptoms. She asked if 7 day supply could be sent to pharmacy? Please advise

## 2021-12-29 NOTE — Telephone Encounter (Signed)
7 days really isn't indicated. Call in a 5 day supply of Bactrim DS, 1 po BID. If she isn't better she needs to be seen

## 2021-12-29 NOTE — Telephone Encounter (Signed)
Left detailed message on voicemail per DPR access. Rx sent.

## 2022-01-17 ENCOUNTER — Encounter: Payer: BC Managed Care – PPO | Admitting: Obstetrics and Gynecology

## 2022-01-17 ENCOUNTER — Telehealth: Payer: Self-pay | Admitting: *Deleted

## 2022-01-17 NOTE — Telephone Encounter (Signed)
Call returned to patient.  Patient request to cancel Hysteroscopy D&C scheduled for 02/07/22 and pre/post op appts due to [facility] cost. Patient declines to reschedule at this time.   Discussed importance of further evaluation of PMB bleeding, patient again declines to reschedule at this time. Offered OV to further discuss, patient declined. Patient states she has not noticed any vaginal bleeding or blood in stool. Advised I will provide update to Dr. Talbert Nan and cancel surgery at this time. I will return call if any additional recommendations. Patient verbalizes understanding.   Call placed to central scheduling, spoke with [facility]. Surgery cancelled on 02/07/22.   Routing to Dr. Talbert Nan   Cc: [facility]

## 2022-01-20 ENCOUNTER — Telehealth: Payer: Self-pay

## 2022-01-20 DIAGNOSIS — R1084 Generalized abdominal pain: Secondary | ICD-10-CM | POA: Diagnosis not present

## 2022-01-20 DIAGNOSIS — R197 Diarrhea, unspecified: Secondary | ICD-10-CM | POA: Diagnosis not present

## 2022-01-20 DIAGNOSIS — R1032 Left lower quadrant pain: Secondary | ICD-10-CM | POA: Diagnosis not present

## 2022-01-20 NOTE — Telephone Encounter (Signed)
Patient called for pathology result. Advised:  Hi Haley Santana, Your pap and your endometrial biopsy are negative. We should proceed with the hysteroscopy as scheduled. Please reach out with any questions.  Best, Sumner Boast  Written by Salvadore Dom, MD on 12/22/2021  8:41 AM EDT Advised My Chart message sent 12/22/21 by Dr. Talbert Nan. Patient said she doesn't go into My Chart. I asked her about disabling it but she declined. I recommended that if she is going to keep it that she logs in when she receives a text/e-mail that message has been sent or result is available so that she does not miss anything. She agreed.

## 2022-02-01 ENCOUNTER — Other Ambulatory Visit (HOSPITAL_COMMUNITY): Payer: BC Managed Care – PPO

## 2022-02-01 ENCOUNTER — Encounter (HOSPITAL_COMMUNITY): Payer: BC Managed Care – PPO

## 2022-02-01 NOTE — Telephone Encounter (Signed)
Please make sure the patient is aware that her u/s was concerning for a polyp, endometrial biopsies typically miss polyps, it is possible for polyps to have cancer in them. If she doesn't want to do the hysteroscopy, we could do a sonohysterogram to try to more clearly determine if there is a polyp in her cavity.  I would encourage her to get care here or elsewhere. Please offer a payment plan or referral to the clinic if that would be any better.

## 2022-02-04 ENCOUNTER — Ambulatory Visit (HOSPITAL_COMMUNITY): Payer: BC Managed Care – PPO | Admitting: Physician Assistant

## 2022-02-07 ENCOUNTER — Ambulatory Visit (HOSPITAL_BASED_OUTPATIENT_CLINIC_OR_DEPARTMENT_OTHER): Admit: 2022-02-07 | Payer: BC Managed Care – PPO | Admitting: Obstetrics and Gynecology

## 2022-02-07 ENCOUNTER — Encounter (HOSPITAL_BASED_OUTPATIENT_CLINIC_OR_DEPARTMENT_OTHER): Payer: Self-pay

## 2022-02-07 SURGERY — DILATATION & CURETTAGE/HYSTEROSCOPY WITH MYOSURE
Anesthesia: Choice

## 2022-02-07 NOTE — Telephone Encounter (Signed)
Spoke with patient.  Advised per Dr. Talbert Nan. Patient is aware of options, declines SHGM or surgery at this time. Patient states she has too much going on to schedule at this time. States she will consider and return call if she desires to proceed with options presented to her. Questions answered. Patient verbalizes understanding.   Routing to Dr. Rosann Auerbach.   Encounter closed.

## 2022-02-11 ENCOUNTER — Other Ambulatory Visit: Payer: Self-pay

## 2022-02-11 DIAGNOSIS — Z170 Estrogen receptor positive status [ER+]: Secondary | ICD-10-CM

## 2022-02-15 ENCOUNTER — Ambulatory Visit (HOSPITAL_COMMUNITY)
Admission: RE | Admit: 2022-02-15 | Discharge: 2022-02-15 | Disposition: A | Discharge status: Home / Self Care | Payer: Medicare Other | Source: Ambulatory Visit | Attending: Hematology and Oncology | Admitting: Hematology and Oncology

## 2022-02-15 ENCOUNTER — Inpatient Hospital Stay: Payer: Medicare Other | Attending: Hematology

## 2022-02-15 ENCOUNTER — Other Ambulatory Visit (HOSPITAL_COMMUNITY): Payer: Self-pay | Admitting: Hematology and Oncology

## 2022-02-15 DIAGNOSIS — Z170 Estrogen receptor positive status [ER+]: Secondary | ICD-10-CM | POA: Insufficient documentation

## 2022-02-15 DIAGNOSIS — Z9889 Other specified postprocedural states: Secondary | ICD-10-CM | POA: Insufficient documentation

## 2022-02-15 DIAGNOSIS — Z853 Personal history of malignant neoplasm of breast: Secondary | ICD-10-CM | POA: Diagnosis not present

## 2022-02-15 DIAGNOSIS — C50412 Malignant neoplasm of upper-outer quadrant of left female breast: Secondary | ICD-10-CM | POA: Insufficient documentation

## 2022-02-15 LAB — COMPREHENSIVE METABOLIC PANEL
ALT: 27 U/L (ref 0–44)
AST: 22 U/L (ref 15–41)
Albumin: 3.9 g/dL (ref 3.5–5.0)
Alkaline Phosphatase: 55 U/L (ref 38–126)
Anion gap: 6 (ref 5–15)
BUN: 18 mg/dL (ref 8–23)
CO2: 25 mmol/L (ref 22–32)
Calcium: 8.9 mg/dL (ref 8.9–10.3)
Chloride: 110 mmol/L (ref 98–111)
Creatinine, Ser: 0.83 mg/dL (ref 0.44–1.00)
GFR, Estimated: >60 mL/min (ref >60)
Glucose, Bld: 91 mg/dL (ref 70–99)
Potassium: 4 mmol/L (ref 3.5–5.1)
Sodium: 141 mmol/L (ref 135–145)
Total Bilirubin: 0.6 mg/dL (ref 0.3–1.2)
Total Protein: 7 g/dL (ref 6.5–8.1)

## 2022-02-15 LAB — CBC WITH DIFFERENTIAL/PLATELET
Abs Immature Granulocytes: 0.02 10*3/uL (ref 0.00–0.07)
Basophils Absolute: 0.1 10*3/uL (ref 0.0–0.1)
Basophils Relative: 1 %
Eosinophils Absolute: 0.1 10*3/uL (ref 0.0–0.5)
Eosinophils Relative: 2 %
HCT: 38.8 % (ref 36.0–46.0)
Hemoglobin: 13 g/dL (ref 12.0–15.0)
Immature Granulocytes: 0 %
Lymphocytes Relative: 38 %
Lymphs Abs: 1.8 10*3/uL (ref 0.7–4.0)
MCH: 31.2 pg (ref 26.0–34.0)
MCHC: 33.5 g/dL (ref 30.0–36.0)
MCV: 93 fL (ref 80.0–100.0)
Monocytes Absolute: 0.5 10*3/uL (ref 0.1–1.0)
Monocytes Relative: 10 %
Neutro Abs: 2.2 10*3/uL (ref 1.7–7.7)
Neutrophils Relative %: 49 %
Platelets: 220 10*3/uL (ref 150–400)
RBC: 4.17 MIL/uL (ref 3.87–5.11)
RDW: 13.1 % (ref 11.5–15.5)
WBC: 4.7 10*3/uL (ref 4.0–10.5)
nRBC: 0 % (ref 0.0–0.2)

## 2022-02-15 LAB — LACTATE DEHYDROGENASE: LDH: 153 U/L (ref 98–192)

## 2022-02-15 LAB — VITAMIN D 25 HYDROXY (VIT D DEFICIENCY, FRACTURES): Vit D, 25-Hydroxy: 67.78 ng/mL (ref 30–100)

## 2022-02-16 ENCOUNTER — Ambulatory Visit (INDEPENDENT_AMBULATORY_CARE_PROVIDER_SITE_OTHER): Payer: Medicare Other | Admitting: Internal Medicine

## 2022-02-16 ENCOUNTER — Encounter: Payer: Self-pay | Admitting: Internal Medicine

## 2022-02-16 VITALS — BP 143/84 | HR 89 | Temp 97.8°F | Ht 65.0 in | Wt 164.0 lb

## 2022-02-16 DIAGNOSIS — K648 Other hemorrhoids: Secondary | ICD-10-CM | POA: Diagnosis not present

## 2022-02-16 DIAGNOSIS — K625 Hemorrhage of anus and rectum: Secondary | ICD-10-CM | POA: Diagnosis not present

## 2022-02-16 DIAGNOSIS — K219 Gastro-esophageal reflux disease without esophagitis: Secondary | ICD-10-CM | POA: Diagnosis not present

## 2022-02-16 DIAGNOSIS — R1032 Left lower quadrant pain: Secondary | ICD-10-CM | POA: Diagnosis not present

## 2022-02-16 MED ORDER — OMEPRAZOLE 20 MG PO CPDR: 20.0000 mg | DELAYED_RELEASE_CAPSULE | Freq: Two times a day (BID) | ORAL | 3 refills | Status: AC

## 2022-02-16 NOTE — Patient Instructions (Signed)
Continue on omeprazole for your chronic acid reflux.  I sent in a new prescription to [facility].  It may be cheaper to just buy it over-the-counter.  Would recommend you follow back up with gynecology in regards to your chronic pelvic pain.  If you would like to undergo hemorrhoid banding for your internal hemorrhoids/bleeding, then call our office and let us know that you would like to get set up for hemorrhoid banding with Roseanne Kaufman.  Otherwise follow-up in 6 months.  It is always a pleasure seeing you.  Dr. Abbey Chatters

## 2022-02-16 NOTE — Progress Notes (Signed)
Referring Provider: Michael Boston, MD Primary Care Physician:  Michael Boston, MD Primary GI:  Dr. Abbey Chatters  Chief Complaint  Patient presents with   Blood In Stools    Blood in stools, nausea, throbbing and sharp pain in lower abdomen.     HPI:   Haley Santana is a 61 y.o. female who presents to the clinic today for follow-up visit.  Has numerous complaints for me.  Has chronic GERD which she is taking  omeprazole 20 mg twice daily.  EGD 10/16/2020 showed LA grade C esophagitis and gastritis.  Also small hiatal hernia.  Gastric biopsies benign.  Colonoscopy 10/16/2020 unremarkable besides internal hemorrhoids and diverticulosis.  Random colon biopsies negative.  States her reflux is much improved on omeprazole.  Currently taking once a day, sometimes twice.  Also complaining of left lower quadrant abdominal pain/pelvic pain.  History of multiple uterine fibroids as well as endometrial polyp.  Following gynecology who recommended hysteroscopy for further evaluation though patient canceled this.  For her chronic abdominal pain, I started her on amitriptyline 10 mg nightly on previous visit.  She states she cannot handle the side effects including grogginess and dry mouth so she stopped this medication.  Bowels moving well.    Has had increased rectal bleeding.  States for the last 2 weeks she has had bright red blood with every bowel movement.  Past Medical History:  Diagnosis Date   Arthritis    Breast cancer ([place]) 02/20/2017   left   Cancer ([place])    Diverticulitis    GERD (gastroesophageal reflux disease)    H/O benign breast biopsy 1984   right    Past Surgical History:  Procedure Laterality Date   BIOPSY  10/16/2020   Procedure: BIOPSY;  Surgeon: [name], DO;  [facility];  Service: Endoscopy;;   BREAST LUMPECTOMY     CHOLECYSTECTOMY     COLONOSCOPY WITH PROPOFOL N/A 10/16/2020   Procedure: COLONOSCOPY WITH PROPOFOL;  Surgeon: [name], DO;   [facility];  Service: Endoscopy;  Laterality: N/A;  PM   CYST EXCISION     ESOPHAGOGASTRODUODENOSCOPY (EGD) WITH PROPOFOL N/A 10/16/2020   Procedure: ESOPHAGOGASTRODUODENOSCOPY (EGD) WITH PROPOFOL;  Surgeon: [name], DO;  [facility];  Service: Endoscopy;  Laterality: N/A;   HERNIA REPAIR      Current Outpatient Medications  Medication Sig Dispense Refill   omeprazole (PRILOSEC) 20 MG capsule Take 1 capsule (20 mg total) by mouth 2 (two) times daily before a meal. OTC one daily 60 capsule 11   predniSONE (DELTASONE) 10 MG tablet TAKE 3 TABLETS BY MOUTH DAILY FOR 2 DAYS THEN 2 TABLETS DAILY FOR 5 DAYS THEN 1 TABLET DAILY UNTIL ALL TAKEN     tamoxifen (NOLVADEX) 20 MG tablet Take 1 tablet (20 mg total) by mouth daily. (Patient not taking: Reported on 02/16/2022) 90 tablet 3   No current facility-administered medications for this visit.    Allergies as of 02/16/2022 - Review Complete 02/16/2022  Allergen Reaction Noted   Banana flavor  12/13/2021   Dust mite extract Other (See Comments) 07/24/2018   Fluoride preparations Itching 02/02/2021   Other  07/24/2018   Shellfish allergy Swelling 10/06/2021   Latex Rash and Other (See Comments) 07/24/2018    Family History  Problem Relation Age of Onset   Hypertension Mother    Cancer Mother    Cancer Father    Cancer Sister  Diabetes Brother    Cancer Brother    Cancer Maternal Aunt        Ovarian   Breast cancer Maternal Aunt     Social History   Socioeconomic History   Marital status: Single    Spouse name: Not on file   Number of children: Not on file   Years of education: Not on file   Highest education level: Not on file  Occupational History   Not on file  Tobacco Use   Smoking status: Former    Passive exposure: Past   Smokeless tobacco: Never  Vaping Use   Vaping Use: Never used  Substance and Sexual Activity   Alcohol use: Yes    Comment: 1-2 times per year   Drug use: Never    Sexual activity: Not Currently    Partners: Male    Birth control/protection: Post-menopausal  Other Topics Concern   Not on file  Social History Narrative   Not on file   Social Determinants of Health   Financial Resource Strain: Low Risk  (02/02/2021)   Overall Financial Resource Strain (CARDIA)    Difficulty of Paying Living Expenses: Not hard at all  Food Insecurity: No Food Insecurity (02/02/2021)   Hunger Vital Sign    Worried About Running Out of Food in the Last Year: Never true    [place] in the Last Year: Never true  Transportation Needs: No Transportation Needs (02/02/2021)   PRAPARE - Hydrologist (Medical): No    Lack of Transportation (Non-Medical): No  Physical Activity: Insufficiently Active (02/02/2021)   Exercise Vital Sign    Days of Exercise per Week: 5 days    Minutes of Exercise per Session: 20 min  Stress: No Stress Concern Present (02/02/2021)   [place]    Feeling of Stress : Not at all  Social Connections: Socially Isolated (02/02/2021)   Social Connection and Isolation Panel [NHANES]    Frequency of Communication with Friends and Family: More than three times a week    Frequency of Social Gatherings with Friends and Family: More than three times a week    Attends Religious Services: Never    Marine scientist or Organizations: No    Attends Archivist Meetings: Never    Marital Status: Divorced    Subjective: Review of Systems  Constitutional:  Negative for chills and fever.  HENT:  Negative for congestion and hearing loss.   Eyes:  Negative for blurred vision and double vision.  Respiratory:  Negative for cough and shortness of breath.   Cardiovascular:  Negative for chest pain and palpitations.  Gastrointestinal:  Positive for abdominal pain, heartburn and nausea. Negative for blood in stool, constipation, diarrhea, melena and  vomiting.  Genitourinary:  Negative for dysuria and urgency.  Musculoskeletal:  Negative for joint pain and myalgias.  Skin:  Negative for itching and rash.  Neurological:  Negative for dizziness and headaches.  Psychiatric/Behavioral:  Negative for depression. The patient is not nervous/anxious.      Objective: BP (!) 143/84 (BP Location: Right Arm, Patient Position: Sitting, Cuff Size: Large)   Pulse 89   Temp 97.8 F (36.6 C) (Oral)   Ht '5\' 5"'$  (1.651 m)   Wt 164 lb (74.4 kg)   BMI 27.29 kg/m  Physical Exam Constitutional:      Appearance: Normal appearance.  HENT:     Head: Normocephalic and  atraumatic.  Eyes:     Extraocular Movements: Extraocular movements intact.     Conjunctiva/sclera: Conjunctivae normal.  Cardiovascular:     Rate and Rhythm: Normal rate and regular rhythm.  Pulmonary:     Effort: Pulmonary effort is normal.     Breath sounds: Normal breath sounds.  Abdominal:     General: Bowel sounds are normal.     Palpations: Abdomen is soft.  Musculoskeletal:        General: No swelling. Normal range of motion.     Cervical back: Normal range of motion and neck supple.  Skin:    General: Skin is warm and dry.     Coloration: Skin is not jaundiced.  Neurological:     General: No focal deficit present.     Mental Status: She is alert and oriented to person, place, and time.  Psychiatric:        Mood and Affect: Mood normal.        Behavior: Behavior normal.      Assessment: *Chronic GERD-improved *Chronic nausea *Abdominal pain/pelvic pain *Rectal bleeding  Plan: GERD improved on omeprazole twice daily.  I counseled her that she could try taking this medication once daily and see how she does.  No dysphagia odynophagia.  Refill sent today  I do not think that her pelvic pain is GI related as she has had extensive work-up.  I recommended that she reach back out to her gynecologist as it was previously recommended she undergo hysteroscopy.  She  states she will do this.  In regards to her ongoing rectal bleeding, likely due to internal hemorrhoids.  Discussed hemorrhoid banding in depth with patient today.  Handout given.  If she would like to have this done, all she has to do is call our office and set it up with Roseanne Kaufman.   Otherwise follow-up in 6 months.   02/16/2022 11:14 AM   Disclaimer: This note was dictated with voice recognition software. Similar sounding words can inadvertently be transcribed and may not be corrected upon review.

## 2022-02-22 ENCOUNTER — Encounter: Payer: BC Managed Care – PPO | Admitting: Obstetrics and Gynecology

## 2022-02-22 NOTE — Progress Notes (Unsigned)
Haley Santana, Haley Santana [id number]   CLINIC:  Medical Oncology/Hematology  PCP:  Michael Boston, [place] [address] [phone]   REASON FOR VISIT:  Follow-up for stage I left breast cancer  PRIOR THERAPY: - Lumpectomy/lymph node biopsy (01/25/2017) - Partial breast radiation (completed October 2018)  NGS Results: Myriad myrisk-heterozygosity for MUTYH with slightly increased risk for colon cancer  CURRENT THERAPY: Tamoxifen (since October 2018)  BRIEF ONCOLOGIC HISTORY:  Oncology History  Breast cancer of upper-outer quadrant of left female breast ([place])  08/03/2018 Initial Diagnosis   Breast cancer of upper-outer quadrant of left female breast ([place])   08/03/2018 Cancer Staging   Staging form: Breast, AJCC 8th Edition - Clinical stage from 08/03/2018: Stage IA (cT1a, cN0, cM0, G1, ER+, PR+, HER2-) - Signed by Derek Jack, MD on 08/03/2018     CANCER STAGING: Cancer Staging  Breast cancer of upper-outer quadrant of left female breast [facility]) Staging form: Breast, AJCC 8th Edition - Clinical stage from 08/03/2018: Stage IA (cT1a, cN0, cM0, G1, ER+, PR+, HER2-) - Signed by Derek Jack, MD on 08/03/2018   INTERVAL HISTORY:  Haley Santana, a 61 y.o. female, returns for routine follow-up of her history of left-sided breast cancer. Haley Santana was last seen on 02/02/2021 by Dr. Lorenso Courier.   At today's visit, she  reports feeling fairly well.  She reports that she was recently diagnosed with bleeding peptic ulcers, but that these have resolved.  Most recent mammogram on 02/15/2022 showed stable lumpectomy changes in the left breast, but no mammographic evidence of malignancy in either breast.  She denies any symptoms of recurrence such as new lumps, bone pain, chest pain, dyspnea, or abdominal pain.  She has no new headaches, seizures, or focal neurologic deficits.  No B symptoms such as fever, night sweats,  unintentional weight loss.  She does report intermittent chills and hot flashes at night.  She reports that she stopped taking tamoxifen on 01/20/2022 because she "hates it," particularly the bad taste that it leaves in her mouth after she takes it.  She also reports pelvic pain and an episode of vaginal bleeding several months ago.  Work-up by gynecologist including pelvic ultrasound revealed 2 subserosal uterine fibroids, asymmetrically thickened endometrium, and 3D imaging suggestive of a 1.3 cm intracavity mass with feeder vessel.  Endometrial biopsy was negative for malignancy.  Gynecologist (Dr. Talbert Nan) recommended additional work-up with hysteroscopy with dilation and curettage, but patient delayed this due to financial constraints.  Patient reports that she is not sure if she has gone through menopause.  She reports that she was having periods at the time of her breast cancer diagnosis, therefore placed on tamoxifen.  Her periods stopped when she started tamoxifen.  She reports 85% energy and 90% appetite.  She intentionally lost 14 pounds this year due to dietary changes.   REVIEW OF SYSTEMS:  Review of Systems  Constitutional:  Positive for fatigue. Negative for appetite change, chills, diaphoresis, fever and unexpected weight change.  HENT:   Negative for lump/mass and nosebleeds.   Eyes:  Negative for eye problems.  Respiratory:  Positive for shortness of breath (occasional, associated with anxiety). Negative for cough and hemoptysis.   Cardiovascular:  Negative for chest pain, leg swelling and palpitations.  Gastrointestinal:  Negative for abdominal pain, blood in stool, constipation, diarrhea, nausea and vomiting.  Genitourinary:  Positive for hematuria (associated with UTI), pelvic pain and vaginal bleeding (pending GYN  workup).   Skin: Negative.   Neurological:  Positive for dizziness and numbness. Negative for headaches and light-headedness.  Hematological:  Does not bruise/bleed  easily.  Psychiatric/Behavioral:  Positive for depression. The patient is nervous/anxious.     PAST MEDICAL/SURGICAL HISTORY:  Past Medical History:  Diagnosis Date   Arthritis    Breast cancer ([place]) 02/20/2017   left   Cancer ([place])    Diverticulitis    GERD (gastroesophageal reflux disease)    H/O benign breast biopsy 1984   right   Past Surgical History:  Procedure Laterality Date   BIOPSY  10/16/2020   Procedure: BIOPSY;  Surgeon: [name], DO;  [facility];  Service: Endoscopy;;   BREAST LUMPECTOMY     CHOLECYSTECTOMY     COLONOSCOPY WITH PROPOFOL N/A 10/16/2020   Procedure: COLONOSCOPY WITH PROPOFOL;  Surgeon: [name], DO;  [facility];  Service: Endoscopy;  Laterality: N/A;  PM   CYST EXCISION     ESOPHAGOGASTRODUODENOSCOPY (EGD) WITH PROPOFOL N/A 10/16/2020   Procedure: ESOPHAGOGASTRODUODENOSCOPY (EGD) WITH PROPOFOL;  Surgeon: [name], DO;  [facility];  Service: Endoscopy;  Laterality: N/A;   HERNIA REPAIR      SOCIAL HISTORY:  Social History   Socioeconomic History   Marital status: Single    Spouse name: Not on file   Number of children: Not on file   Years of education: Not on file   Highest education level: Not on file  Occupational History   Not on file  Tobacco Use   Smoking status: Former    Passive exposure: Past   Smokeless tobacco: Never  Vaping Use   Vaping Use: Never used  Substance and Sexual Activity   Alcohol use: Yes    Comment: 1-2 times per year   Drug use: Never   Sexual activity: Not Currently    Partners: Male    Birth control/protection: Post-menopausal  Other Topics Concern   Not on file  Social History Narrative   Not on file   Social Determinants of Health   Financial Resource Strain: Low Risk  (02/02/2021)   Overall Financial Resource Strain (CARDIA)    Difficulty of Paying Living Expenses: Not hard at all  Food Insecurity: No Food Insecurity (02/02/2021)    Hunger Vital Sign    Worried About Running Out of Food in the Last Year: Never true    [place] in the Last Year: Never true  Transportation Needs: No Transportation Needs (02/02/2021)   PRAPARE - Hydrologist (Medical): No    Lack of Transportation (Non-Medical): No  Physical Activity: Insufficiently Active (02/02/2021)   Exercise Vital Sign    Days of Exercise per Week: 5 days    Minutes of Exercise per Session: 20 min  Stress: No Stress Concern Present (02/02/2021)   [place]    Feeling of Stress : Not at all  Social Connections: Socially Isolated (02/02/2021)   Social Connection and Isolation Panel [NHANES]    Frequency of Communication with Friends and Family: More than three times a week    Frequency of Social Gatherings with Friends and Family: More than three times a week    Attends Religious Services: Never    Marine scientist or Organizations: No    Attends Archivist Meetings: Never    Marital Status: Divorced  Human resources officer Violence: Not At Risk (  02/02/2021)   Humiliation, Afraid, Rape, and Kick questionnaire    Fear of Current or Ex-Partner: No    Emotionally Abused: No    Physically Abused: No    Sexually Abused: No    FAMILY HISTORY:  Family History  Problem Relation Age of Onset   Hypertension Mother    Cancer Mother    Cancer Father    Cancer Sister    Diabetes Brother    Cancer Brother    Cancer Maternal Aunt        Ovarian   Breast cancer Maternal Aunt     CURRENT MEDICATIONS:  Current Outpatient Medications  Medication Sig Dispense Refill   omeprazole (PRILOSEC) 20 MG capsule Take 1 capsule (20 mg total) by mouth 2 (two) times daily before a meal. 180 capsule 3   predniSONE (DELTASONE) 10 MG tablet TAKE 3 TABLETS BY MOUTH DAILY FOR 2 DAYS THEN 2 TABLETS DAILY FOR 5 DAYS THEN 1 TABLET DAILY UNTIL ALL TAKEN     tamoxifen (NOLVADEX) 20  MG tablet Take 1 tablet (20 mg total) by mouth daily. (Patient not taking: Reported on 02/16/2022) 90 tablet 3   No current facility-administered medications for this visit.    ALLERGIES:  Allergies  Allergen Reactions   Banana Flavor     Other reaction(s): Unknown   Dust Mite Extract Other (See Comments)    Per allergy test   Fluoride Preparations Itching    Caused throat to feel itchy   Other     Tree nuts--throat swelling/develops sores   Shellfish Allergy Swelling   Latex Rash and Other (See Comments)    Oral bumps/lesions    PHYSICAL EXAM:  Performance status (ECOG): 0 - Asymptomatic  There were no vitals filed for this visit. Wt Readings from Last 3 Encounters:  02/16/22 164 lb (74.4 kg)  12/16/21 166 lb (75.3 kg)  12/13/21 166 lb (75.3 kg)   Physical Exam Constitutional:      Appearance: Normal appearance.  HENT:     Head: Normocephalic and atraumatic.     Mouth/Throat:     Mouth: Mucous membranes are moist.  Eyes:     Extraocular Movements: Extraocular movements intact.     Pupils: Pupils are equal, round, and reactive to light.  Cardiovascular:     Rate and Rhythm: Normal rate and regular rhythm.     Pulses: Normal pulses.     Heart sounds: Normal heart sounds.  Pulmonary:     Effort: Pulmonary effort is normal.     Breath sounds: Normal breath sounds.  Chest:     Comments: Mild lymphedema of left lower breast tissue.  No discrete nodules, masses, or lymphadenopathy of either breast. Abdominal:     General: Bowel sounds are normal.     Palpations: Abdomen is soft.     Tenderness: There is no abdominal tenderness.  Musculoskeletal:        General: No swelling.     Right lower leg: No edema.     Left lower leg: No edema.  Lymphadenopathy:     Cervical: No cervical adenopathy.  Skin:    General: Skin is warm and dry.  Neurological:     General: No focal deficit present.     Mental Status: She is alert and oriented to person, place, and time.   Psychiatric:        Mood and Affect: Mood normal.        Behavior: Behavior normal.      LABORATORY DATA:  I have reviewed the labs as listed.     Latest Ref Rng & Units 02/15/2022   10:27 AM 01/26/2021   10:27 AM 01/21/2020    9:02 AM  CBC  WBC 4.0 - 10.5 K/uL 4.7  5.8  5.1   Hemoglobin 12.0 - 15.0 g/dL 13.0  12.7  12.9   Hematocrit 36.0 - 46.0 % 38.8  38.3  39.7   Platelets 150 - 400 K/uL 220  239  226       Latest Ref Rng & Units 02/15/2022   10:27 AM 01/26/2021   10:27 AM 01/21/2020    9:02 AM  CMP  Glucose 70 - 99 mg/dL 91  112  95   BUN 8 - 23 mg/dL '18  14  17   ' Creatinine 0.44 - 1.00 mg/dL 0.83  0.91  0.90   Sodium 135 - 145 mmol/L 141  137  140   Potassium 3.5 - 5.1 mmol/L 4.0  3.8  4.3   Chloride 98 - 111 mmol/L 110  105  106   CO2 22 - 32 mmol/L '25  26  26   ' Calcium 8.9 - 10.3 mg/dL 8.9  8.9  9.1   Total Protein 6.5 - 8.1 g/dL 7.0  6.7  6.8   Total Bilirubin 0.3 - 1.2 mg/dL 0.6  0.3  0.5   Alkaline Phos 38 - 126 U/L 55  47  42   AST 15 - 41 U/L '22  23  17   ' ALT 0 - 44 U/L '27  26  22     ' DIAGNOSTIC IMAGING:  I have independently reviewed the scans and discussed with the patient. MM DIAG BREAST TOMO BILATERAL  Result Date: 02/15/2022 CLINICAL DATA:  61 year old female with history left cancer post lumpectomy 2018. EXAM: DIGITAL DIAGNOSTIC BILATERAL MAMMOGRAM WITH TOMOSYNTHESIS TECHNIQUE: Bilateral digital diagnostic mammography and breast tomosynthesis was performed. COMPARISON:  Previous exam(s). ACR Breast Density Category b: There are scattered areas of fibroglandular density. FINDINGS: No suspicious masses or calcifications are seen in either breast. Lumpectomy changes in upper-outer left breast are stable. Spot compression magnification view of the left breast lumpectomy site was performed. There is no mammographic evidence of locally recurrent malignancy. IMPRESSION: Stable lumpectomy changes in the left breast. No mammographic evidence malignancy in either breast.  RECOMMENDATION: Screening mammogram in one year.(Code:[id number]-B-01Y) I have discussed the findings and recommendations with the patient. If applicable, a reminder letter will be sent to the patient regarding the next appointment. BI-RADS CATEGORY  2: Benign. Electronically Signed   By: Everlean Alstrom M.D.   On: 02/15/2022 10:13     ASSESSMENT & PLAN: 1.  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 - Partial breast radiation therapy dose of 34 Gray at 3.4GY per fraction given twice daily 6 hours apart in 10 fractions completed on 03/24/2017 at [facility] myrisk-heterozygosity for MUTYH with slightly increased risk for colon cancer. (Colonoscopy in 2019 reportedly negative.) - Tamoxifen started on 03/2017. She is tolerating well.  She stopped taking tamoxifen on 01/20/2022 per her own decision.  She would have completed 5 years of treatment as of October 2023. - Patient was still having periods at the time of starting tamoxifen.  She reports that she stopped having periods once she started tamoxifen,  therefore she is not sure if she has gone through menopause or not.  Discussed with patient's gynecologist (Dr. Talbert Nan), who reports that since endometrial biopsy showed atrophy, patient is most likely menopausal.  GYN has plans to check [facility] to confirm. - Most recent mammogram (02/15/2022): Stable lumpectomy changes in the left breast, but no mammographic evidence of malignancy in either breast. - Labs (02/15/2022) showed normal CBC, normal CMP, normal LDH, normal vitamin D. - Physical exam today showed mild lymphedema of left lower breast tissue.  No discrete nodules, masses, or lymphadenopathy of either breast. - ROS negative for any symptoms of recurrent breast cancer  at this time. - PLAN: We will obtain BCI testing to see if patient would benefit from prolonged hormonal treatment of her breast cancer. - If she would benefit from prolonged treatment, would consider switching to anastrozole since she is postmenopausal (pending confirmation per GYN). - Patient instructed to HOLD tamoxifen pending GYN work-up of pelvic pain and postmenopausal bleeding. - I will see her back in 3 months to discuss BCI testing results and whether or not she would benefit from further treatment. - Otherwise, RTC in September 2020 for with repeat labs, screening mammogram, and office visit   2.  Bone health - DEXA scan completed 12/2018 showed normal bone density. - Recommended patient to continue vitamin D and calcium daily.   3.  Pelvic pain and postmenstrual bleeding - Ongoing pelvic pain for the past 3 months, plus an episode of vaginal bleeding several months ago. - Work-up by gynecologist including pelvic ultrasound revealed 2 subserosal uterine fibroids, asymmetrically thickened endometrium, and 3D imaging suggestive of a 1.3 cm intracavity mass with feeder vessel. - Endometrial biopsy was negative for malignancy. - Gynecologist (Dr. Talbert Nan) recommended additional work-up with hysteroscopy with dilation and curettage, but patient delayed this due to financial constraints. - PLAN: Discussed with patient the importance of additional work-up for her endometrial abnormalities, particularly since she is at increased risk of endometrial cancer due to being on tamoxifen for the past 5 years. - Hold tamoxifen for the time being. - Patient has agreed to follow-up with Dr. Talbert Nan for recommended hysteroscopy, dilation, and created.  I have notified Dr. Talbert Nan of this as well.   All questions were answered. The patient knows to call the clinic with any problems, questions or concerns.  Medical decision making: Moderate  Time spent on visit: I spent 25 minutes counseling the  patient face to face. The total time spent in the appointment was 30 minutes and more than 50% was on counseling.   Harriett Rush, PA-C  02/23/2022 10:56 AM

## 2022-02-23 ENCOUNTER — Inpatient Hospital Stay (HOSPITAL_BASED_OUTPATIENT_CLINIC_OR_DEPARTMENT_OTHER): Payer: Medicare Other | Admitting: Physician Assistant

## 2022-02-23 ENCOUNTER — Telehealth: Payer: Self-pay | Admitting: *Deleted

## 2022-02-23 VITALS — BP 137/64 | HR 82 | Temp 98.3°F | Resp 18 | Ht 65.0 in | Wt 160.3 lb

## 2022-02-23 DIAGNOSIS — Z170 Estrogen receptor positive status [ER+]: Secondary | ICD-10-CM

## 2022-02-23 DIAGNOSIS — N950 Postmenopausal bleeding: Secondary | ICD-10-CM

## 2022-02-23 DIAGNOSIS — C50412 Malignant neoplasm of upper-outer quadrant of left female breast: Secondary | ICD-10-CM

## 2022-02-23 NOTE — Telephone Encounter (Signed)
Left message to call Sharee Pimple, RN at [facility], [phone].      Salvadore Dom, MD      12/20/21  4:35 PM Note Surgery: CPT [id number] - Hysteroscopy/D&C/Myosure,    Diagnosis: N95.0 Postmenopausal Bleeding,,   Location: [facility]   Status: Outpatient   Time: 30 Minutes   Assistant: N/A   Urgency: At Patient's Convenience   Pre-Op Appointment: Completed   Post-Op Appointment(s): 1 Week,    Time Out Of Work: Day Of Surgery, 1 Day Post Op

## 2022-02-23 NOTE — Telephone Encounter (Signed)
-----   Message from Salvadore Dom, MD sent at 02/23/2022 11:43 AM EDT ----- I got a message from Oncology that this patient is now wanting to go ahead with her previously scheduled hysteroscopy, D&C. Thanks, Sharee Pimple

## 2022-02-23 NOTE — Patient Instructions (Signed)
[facility] **   You were seen today by Tarri Abernethy PA-C for your history of left-sided breast cancer.    POSTMENOPAUSAL BLEEDING: Due to the abnormalities on your endometrial ultrasound, it is extremely important that you follow-up with Dr. Talbert Nan for further testing.  HISTORY OF BREAST CANCER: You did not have any signs of recurrent breast cancer and your most recent labs, mammogram, or exam today. For now, you should NOT take any tamoxifen. We will send your original breast cancer tissue for further testing to see if you would benefit from additional treatment with tamoxifen or anastrozole. Before deciding about future anastrozole or tamoxifen, you will need further work-up of your uterine abnormalities with Dr. Talbert Nan. We will see you back in 3 months to discuss next steps.  FOLLOW-UP APPOINTMENT: Office visit in 3 months to discuss next steps  ** Thank you for trusting me with your healthcare!  I strive to provide all of my patients with quality care at each visit.  If you receive a survey for this visit, I would be so grateful to you for taking the time to provide feedback.  Thank you in advance!  ~ Kaniesha Barile                   Dr. Derek Jack   &   Tarri Abernethy, PA-C   - - - - - - - - - - - - - - - - - -    Thank you for choosing [facility] at [facility] to provide your oncology and hematology care.  To afford each patient quality time with our provider, please arrive at least 15 minutes before your scheduled appointment time.   If you have a lab appointment with the [facility] please come in thru the Main Entrance and check in at the main information desk.  You need to re-schedule your appointment should you arrive 10 or more minutes late.  We strive to give you quality time with our providers, and arriving late affects you and other patients whose  appointments are after yours.  Also, if you no show three or more times for appointments you may be dismissed from the clinic at the providers discretion.     Again, thank you for choosing [facility].  Our hope is that these requests will decrease the amount of time that you wait before being seen by our physicians.       _____________________________________________________________  Should you have questions after your visit to [facility], please contact our office at [phone] and follow the prompts.  Our office hours are 8:00 a.m. and 4:30 p.m. Monday - Friday.  Please note that voicemails left after 4:00 p.m. may not be returned until the following business day.  We are closed weekends and major holidays.  You do have access to a nurse 24-7, just call the main number to the clinic [phone] and do not press any options, hold on the line and a nurse will answer the phone.    For prescription refill requests, have your pharmacy contact our office and allow 72 hours.

## 2022-02-24 NOTE — Telephone Encounter (Signed)
Spoke with patient. Reviewed surgery dates. Patient request to proceed with surgery on 04/04/22, will be traveling 1st week of October.  Advised patient I will forward to business office for return call. I will return call once surgery date and time confirmed. Patient verbalizes understanding and is agreeable.   Surgery request sent.   [employer] -patient has new insurance.

## 2022-03-02 ENCOUNTER — Encounter: Payer: Self-pay | Admitting: *Deleted

## 2022-03-02 NOTE — Telephone Encounter (Signed)
Spoke with patient. Patient asking if earlier surgery date available. Advised no earlier date at this time, can call if cancellation. Patient request to proceed as scheduled.    Advised surgery is scheduled for 04/04/22, [place] at 26.  Surgery instruction sheet and hospital brochure reviewed, printed copy will be mailed.  Patient verbalizes understanding and is agreeable.  Routing to provider. Encounter closed.

## 2022-03-02 NOTE — Telephone Encounter (Signed)
Spoke with patient regarding surgery benefits. Patient acknowledges understanding of information presented. Patient is aware that benefits presented are professional benefits only. Patient is aware the hospital will call with facility benefits. See account note.Front desk aware of PR  Routing to [employer], Therapist, sports.

## 2022-03-09 ENCOUNTER — Encounter: Payer: Self-pay | Admitting: Obstetrics and Gynecology

## 2022-03-09 ENCOUNTER — Ambulatory Visit (INDEPENDENT_AMBULATORY_CARE_PROVIDER_SITE_OTHER): Payer: Medicare Other | Admitting: Obstetrics and Gynecology

## 2022-03-09 VITALS — BP 134/86 | Ht 65.0 in | Wt 163.0 lb

## 2022-03-09 DIAGNOSIS — Z9229 Personal history of other drug therapy: Secondary | ICD-10-CM

## 2022-03-09 DIAGNOSIS — N950 Postmenopausal bleeding: Secondary | ICD-10-CM | POA: Diagnosis not present

## 2022-03-09 NOTE — H&P (View-Only) (Signed)
GYNECOLOGY  VISIT   HPI: 61 y.o.   Single White or Caucasian Not Hispanic or Latino  female   [phone] with No LMP recorded. Patient is postmenopausal.   here for pre op. H/o PMP bleeding. U/S concerning for a polyp. She has a H/O breast cancer, she was on tamoxifen, stopped in 8/23. She was going to have surgery last month, had to delay to have dental surgery.   U/S from 12/16/21: Impression:  Anteverted, enlarged uterus 2 subserosal fibroids toward the right side. Largest is 4.4 x 4.2 cm Asymmetrically thickened endometrium 3 D imaging suggestive of a 1.3 cm intracavity mass with a feeder vessel Atrophic ovaries bilaterally  12/13/21 pathology: A. ENDOMETRIUM, BIOPSY:  Scant inactive to atrophic endometrial epithelium with scant associated  stroma  Negative for breakdown, polyp, atypia, hyperplasia and carcinoma   Pap 12/13/21 negative.   Under stress, 77 year old sister just diagnosed with mesothelioma. This is her only sister.   GYNECOLOGIC HISTORY: No LMP recorded. Patient is postmenopausal. Contraception:none  Menopausal hormone therapy: none         OB History     Gravida  6   Para  2   Term  2   Preterm      AB  4   Living  2      SAB  2   IAB  2   Ectopic      Multiple      Live Births                 Patient Active Problem List   Diagnosis Date Noted   Gastritis 12/13/2021   Hardening of the aorta (main artery of the heart) ([place]) 12/13/2021   Pain in pelvis 12/13/2021   Pain in limb 12/13/2021   Recurrent major depression ([place]) 12/13/2021   LLQ abdominal pain 12/08/2021   Nausea without vomiting 12/08/2021   Breast cancer of upper-outer quadrant of left female breast ([place]) 08/03/2018    Past Medical History:  Diagnosis Date   Arthritis    Breast cancer ([place]) 02/20/2017   left   Cancer ([place])    Diverticulitis    GERD (gastroesophageal reflux disease)    H/O benign breast biopsy 1984   right    Past Surgical History:  Procedure  Laterality Date   BIOPSY  10/16/2020   Procedure: BIOPSY;  Surgeon: [name], DO;  [facility];  Service: Endoscopy;;   BREAST LUMPECTOMY     CHOLECYSTECTOMY     COLONOSCOPY WITH PROPOFOL N/A 10/16/2020   Procedure: COLONOSCOPY WITH PROPOFOL;  Surgeon: [name], DO;  [facility];  Service: Endoscopy;  Laterality: N/A;  PM   CYST EXCISION     ESOPHAGOGASTRODUODENOSCOPY (EGD) WITH PROPOFOL N/A 10/16/2020   Procedure: ESOPHAGOGASTRODUODENOSCOPY (EGD) WITH PROPOFOL;  Surgeon: [name], DO;  [facility];  Service: Endoscopy;  Laterality: N/A;   HERNIA REPAIR      Current Outpatient Medications  Medication Sig Dispense Refill   omeprazole (PRILOSEC) 20 MG capsule Take 1 capsule (20 mg total) by mouth 2 (two) times daily before a meal. 180 capsule 3   predniSONE (DELTASONE) 10 MG tablet TAKE 3 TABLETS BY MOUTH DAILY FOR 2 DAYS THEN 2 TABLETS DAILY FOR 5 DAYS THEN 1 TABLET DAILY UNTIL ALL TAKEN     tamoxifen (NOLVADEX) 20 MG tablet Take 1 tablet (20 mg total) by mouth daily. 90 tablet 3   No current facility-administered medications  for this visit.     ALLERGIES: Banana flavor, Dust mite extract, Fluoride preparations, Other, Shellfish allergy, and Latex  Family History  Problem Relation Age of Onset   Hypertension Mother    Cancer Mother    Cancer Father    Cancer Sister    Diabetes Brother    Cancer Brother    Cancer Maternal Aunt        Ovarian   Breast cancer Maternal Aunt     Social History   Socioeconomic History   Marital status: Single    Spouse name: Not on file   Number of children: Not on file   Years of education: Not on file   Highest education level: Not on file  Occupational History   Not on file  Tobacco Use   Smoking status: Former    Passive exposure: Past   Smokeless tobacco: Never  Vaping Use   Vaping Use: Never used  Substance and Sexual Activity   Alcohol use: Yes    Comment: 1-2 times per  year   Drug use: Never   Sexual activity: Not Currently    Partners: Male    Birth control/protection: Post-menopausal  Other Topics Concern   Not on file  Social History Narrative   Not on file   Social Determinants of Health   Financial Resource Strain: Low Risk  (02/02/2021)   Overall Financial Resource Strain (CARDIA)    Difficulty of Paying Living Expenses: Not hard at all  Food Insecurity: No Food Insecurity (02/02/2021)   Hunger Vital Sign    Worried About Running Out of Food in the Last Year: Never true    [place] in the Last Year: Never true  Transportation Needs: No Transportation Needs (02/02/2021)   PRAPARE - Hydrologist (Medical): No    Lack of Transportation (Non-Medical): No  Physical Activity: Insufficiently Active (02/02/2021)   Exercise Vital Sign    Days of Exercise per Week: 5 days    Minutes of Exercise per Session: 20 min  Stress: No Stress Concern Present (02/02/2021)   [place]    Feeling of Stress : Not at all  Social Connections: Socially Isolated (02/02/2021)   Social Connection and Isolation Panel [NHANES]    Frequency of Communication with Friends and Family: More than three times a week    Frequency of Social Gatherings with Friends and Family: More than three times a week    Attends Religious Services: Never    Marine scientist or Organizations: No    Attends Archivist Meetings: Never    Marital Status: Divorced  Human resources officer Violence: Not At Risk (02/02/2021)   Humiliation, Afraid, Rape, and Kick questionnaire    Fear of Current or Ex-Partner: No    Emotionally Abused: No    Physically Abused: No    Sexually Abused: No    ROS: continues to have groin and lower abdominal pain. Still has blood in her stool, see's GI. Last colonoscopy was a year ago, no polyps.  Prior negative CT. Is going to see Ortho, pain is worsened when  she starts walking. Has some burning in the groin.   PHYSICAL EXAMINATION:    There were no vitals taken for this visit.    General appearance: alert, cooperative and appears stated age Neck: no adenopathy, supple, symmetrical, trachea midline and thyroid normal to inspection and palpation Heart: regular rate and rhythm Lungs:  CTAB Abdomen: soft, non-tender; bowel sounds normal; no masses,  no organomegaly Extremities: normal, atraumatic, no cyanosis Skin: normal color, texture and turgor, no rashes or lesions Lymph: normal cervical supraclavicular and inguinal nodes Neurologic: grossly normal  1. Postmenopausal bleeding U/S concerning for a polyp Plan: hysteroscopy, dilation and curettage. Reviewed risks, including: bleeding, infection, uterine perforation, fluid overload, need for further surgery  - Follicle stimulating hormone (requested by Oncology)  2. History of tamoxifen therapy Recently off of tamoxifen

## 2022-03-09 NOTE — Progress Notes (Signed)
GYNECOLOGY  VISIT   HPI: 61 y.o.   Single White or Caucasian Not Hispanic or Latino  female   [phone] with No LMP recorded. Patient is postmenopausal.   here for pre op. H/o PMP bleeding. U/S concerning for a polyp. She has a H/O breast cancer, she was on tamoxifen, stopped in 8/23. She was going to have surgery last month, had to delay to have dental surgery.   U/S from 12/16/21: Impression:  Anteverted, enlarged uterus 2 subserosal fibroids toward the right side. Largest is 4.4 x 4.2 cm Asymmetrically thickened endometrium 3 D imaging suggestive of a 1.3 cm intracavity mass with a feeder vessel Atrophic ovaries bilaterally  12/13/21 pathology: A. ENDOMETRIUM, BIOPSY:  Scant inactive to atrophic endometrial epithelium with scant associated  stroma  Negative for breakdown, polyp, atypia, hyperplasia and carcinoma   Pap 12/13/21 negative.   Under stress, 70 year old sister just diagnosed with mesothelioma. This is her only sister.   GYNECOLOGIC HISTORY: No LMP recorded. Patient is postmenopausal. Contraception:none  Menopausal hormone therapy: none         OB History     Gravida  6   Para  2   Term  2   Preterm      AB  4   Living  2      SAB  2   IAB  2   Ectopic      Multiple      Live Births                 Patient Active Problem List   Diagnosis Date Noted   Gastritis 12/13/2021   Hardening of the aorta (main artery of the heart) ([place]) 12/13/2021   Pain in pelvis 12/13/2021   Pain in limb 12/13/2021   Recurrent major depression ([place]) 12/13/2021   LLQ abdominal pain 12/08/2021   Nausea without vomiting 12/08/2021   Breast cancer of upper-outer quadrant of left female breast ([place]) 08/03/2018    Past Medical History:  Diagnosis Date   Arthritis    Breast cancer ([place]) 02/20/2017   left   Cancer ([place])    Diverticulitis    GERD (gastroesophageal reflux disease)    H/O benign breast biopsy 1984   right    Past Surgical History:  Procedure  Laterality Date   BIOPSY  10/16/2020   Procedure: BIOPSY;  Surgeon: [name], DO;  [facility];  Service: Endoscopy;;   BREAST LUMPECTOMY     CHOLECYSTECTOMY     COLONOSCOPY WITH PROPOFOL N/A 10/16/2020   Procedure: COLONOSCOPY WITH PROPOFOL;  Surgeon: [name], DO;  [facility];  Service: Endoscopy;  Laterality: N/A;  PM   CYST EXCISION     ESOPHAGOGASTRODUODENOSCOPY (EGD) WITH PROPOFOL N/A 10/16/2020   Procedure: ESOPHAGOGASTRODUODENOSCOPY (EGD) WITH PROPOFOL;  Surgeon: [name], DO;  [facility];  Service: Endoscopy;  Laterality: N/A;   HERNIA REPAIR      Current Outpatient Medications  Medication Sig Dispense Refill   omeprazole (PRILOSEC) 20 MG capsule Take 1 capsule (20 mg total) by mouth 2 (two) times daily before a meal. 180 capsule 3   predniSONE (DELTASONE) 10 MG tablet TAKE 3 TABLETS BY MOUTH DAILY FOR 2 DAYS THEN 2 TABLETS DAILY FOR 5 DAYS THEN 1 TABLET DAILY UNTIL ALL TAKEN     tamoxifen (NOLVADEX) 20 MG tablet Take 1 tablet (20 mg total) by mouth daily. 90 tablet 3   No current facility-administered medications  for this visit.     ALLERGIES: Banana flavor, Dust mite extract, Fluoride preparations, Other, Shellfish allergy, and Latex  Family History  Problem Relation Age of Onset   Hypertension Mother    Cancer Mother    Cancer Father    Cancer Sister    Diabetes Brother    Cancer Brother    Cancer Maternal Aunt        Ovarian   Breast cancer Maternal Aunt     Social History   Socioeconomic History   Marital status: Single    Spouse name: Not on file   Number of children: Not on file   Years of education: Not on file   Highest education level: Not on file  Occupational History   Not on file  Tobacco Use   Smoking status: Former    Passive exposure: Past   Smokeless tobacco: Never  Vaping Use   Vaping Use: Never used  Substance and Sexual Activity   Alcohol use: Yes    Comment: 1-2 times per  year   Drug use: Never   Sexual activity: Not Currently    Partners: Male    Birth control/protection: Post-menopausal  Other Topics Concern   Not on file  Social History Narrative   Not on file   Social Determinants of Health   Financial Resource Strain: Low Risk  (02/02/2021)   Overall Financial Resource Strain (CARDIA)    Difficulty of Paying Living Expenses: Not hard at all  Food Insecurity: No Food Insecurity (02/02/2021)   Hunger Vital Sign    Worried About Running Out of Food in the Last Year: Never true    [place] in the Last Year: Never true  Transportation Needs: No Transportation Needs (02/02/2021)   PRAPARE - Hydrologist (Medical): No    Lack of Transportation (Non-Medical): No  Physical Activity: Insufficiently Active (02/02/2021)   Exercise Vital Sign    Days of Exercise per Week: 5 days    Minutes of Exercise per Session: 20 min  Stress: No Stress Concern Present (02/02/2021)   [place]    Feeling of Stress : Not at all  Social Connections: Socially Isolated (02/02/2021)   Social Connection and Isolation Panel [NHANES]    Frequency of Communication with Friends and Family: More than three times a week    Frequency of Social Gatherings with Friends and Family: More than three times a week    Attends Religious Services: Never    Marine scientist or Organizations: No    Attends Archivist Meetings: Never    Marital Status: Divorced  Human resources officer Violence: Not At Risk (02/02/2021)   Humiliation, Afraid, Rape, and Kick questionnaire    Fear of Current or Ex-Partner: No    Emotionally Abused: No    Physically Abused: No    Sexually Abused: No    ROS: continues to have groin and lower abdominal pain. Still has blood in her stool, see's GI. Last colonoscopy was a year ago, no polyps.  Prior negative CT. Is going to see Ortho, pain is worsened when  she starts walking. Has some burning in the groin.   PHYSICAL EXAMINATION:    There were no vitals taken for this visit.    General appearance: alert, cooperative and appears stated age Neck: no adenopathy, supple, symmetrical, trachea midline and thyroid normal to inspection and palpation Heart: regular rate and rhythm Lungs:  CTAB Abdomen: soft, non-tender; bowel sounds normal; no masses,  no organomegaly Extremities: normal, atraumatic, no cyanosis Skin: normal color, texture and turgor, no rashes or lesions Lymph: normal cervical supraclavicular and inguinal nodes Neurologic: grossly normal  1. Postmenopausal bleeding U/S concerning for a polyp Plan: hysteroscopy, dilation and curettage. Reviewed risks, including: bleeding, infection, uterine perforation, fluid overload, need for further surgery  - Follicle stimulating hormone (requested by Oncology)  2. History of tamoxifen therapy Recently off of tamoxifen

## 2022-03-10 LAB — FOLLICLE STIMULATING HORMONE: FSH: 36.6 m[IU]/mL

## 2022-03-25 ENCOUNTER — Encounter (HOSPITAL_BASED_OUTPATIENT_CLINIC_OR_DEPARTMENT_OTHER): Payer: Self-pay | Admitting: Obstetrics and Gynecology

## 2022-03-25 NOTE — Progress Notes (Signed)
Spoke w/ via phone for pre-op interview--- patient Lab needs dos---- none              Lab results------ none COVID test -----patient states asymptomatic no test needed Arrive at ------- 0530 on 04/04/22 NPO after MN NO Solid Food.  Clear liquids from MN until--- 0330 on 04/04/22 Med rec completed Medications to take morning of surgery ----- prilosec Diabetic medication ----- n/a Patient instructed no nail polish to be worn day of surgery Patient instructed to bring photo id and insurance card day of surgery Patient aware to have Driver (ride ) / caregiver    for 24 hours after surgery - Editor, commissioning (daughter) Patient Special Instructions ----- none Pre-Op special Istructions ----- ERAS drink ordered by MD, instructions given, pt verbalized understanding Patient verbalized understanding of instructions that were given at this phone interview. Patient denies shortness of breath, chest pain, fever, cough at this phone interview.  Lyndel Pleasure, RN

## 2022-04-04 ENCOUNTER — Other Ambulatory Visit: Payer: Self-pay

## 2022-04-04 ENCOUNTER — Ambulatory Visit (HOSPITAL_BASED_OUTPATIENT_CLINIC_OR_DEPARTMENT_OTHER): Payer: Medicare Other | Admitting: Anesthesiology

## 2022-04-04 ENCOUNTER — Ambulatory Visit (HOSPITAL_BASED_OUTPATIENT_CLINIC_OR_DEPARTMENT_OTHER)
Admission: RE | Admit: 2022-04-04 | Discharge: 2022-04-04 | Disposition: A | Discharge status: Home / Self Care | Payer: Medicare Other | Source: Ambulatory Visit | Attending: Obstetrics and Gynecology | Admitting: Obstetrics and Gynecology

## 2022-04-04 ENCOUNTER — Encounter (HOSPITAL_BASED_OUTPATIENT_CLINIC_OR_DEPARTMENT_OTHER)
Admission: RE | Disposition: A | Discharge status: Home / Self Care | Payer: Self-pay | Source: Ambulatory Visit | Attending: Obstetrics and Gynecology

## 2022-04-04 ENCOUNTER — Encounter (HOSPITAL_BASED_OUTPATIENT_CLINIC_OR_DEPARTMENT_OTHER): Payer: Self-pay | Admitting: Obstetrics and Gynecology

## 2022-04-04 DIAGNOSIS — Z853 Personal history of malignant neoplasm of breast: Secondary | ICD-10-CM | POA: Insufficient documentation

## 2022-04-04 DIAGNOSIS — N840 Polyp of corpus uteri: Secondary | ICD-10-CM

## 2022-04-04 DIAGNOSIS — K219 Gastro-esophageal reflux disease without esophagitis: Secondary | ICD-10-CM | POA: Insufficient documentation

## 2022-04-04 DIAGNOSIS — R102 Pelvic and perineal pain: Secondary | ICD-10-CM

## 2022-04-04 DIAGNOSIS — N950 Postmenopausal bleeding: Secondary | ICD-10-CM | POA: Diagnosis present

## 2022-04-04 DIAGNOSIS — M1990 Unspecified osteoarthritis, unspecified site: Secondary | ICD-10-CM | POA: Diagnosis not present

## 2022-04-04 HISTORY — DX: Unspecified hemorrhoids: K64.9

## 2022-04-04 HISTORY — PX: DILATATION & CURETTAGE/HYSTEROSCOPY WITH MYOSURE: SHX6511

## 2022-04-04 SURGERY — DILATATION & CURETTAGE/HYSTEROSCOPY WITH MYOSURE
Anesthesia: General | Site: Uterus

## 2022-04-04 MED ORDER — DIPHENHYDRAMINE HCL 50 MG/ML IJ SOLN
INTRAMUSCULAR | Status: AC
  Filled 2022-04-04: qty 1

## 2022-04-04 MED ORDER — ACETAMINOPHEN 10 MG/ML IV SOLN: 1000.0000 mg | Freq: Once | INTRAVENOUS | Status: DC | PRN

## 2022-04-04 MED ORDER — LIDOCAINE 2% (20 MG/ML) 5 ML SYRINGE
INTRAMUSCULAR | Status: DC | PRN
  Administered 2022-04-04: 60 mg via INTRAVENOUS

## 2022-04-04 MED ORDER — PROPOFOL 10 MG/ML IV BOLUS
INTRAVENOUS | Status: DC | PRN
  Administered 2022-04-04: 130 mg via INTRAVENOUS

## 2022-04-04 MED ORDER — OXYCODONE HCL 5 MG/5ML PO SOLN: 5.0000 mg | Freq: Once | ORAL | Status: DC | PRN

## 2022-04-04 MED ORDER — FENTANYL CITRATE (PF) 100 MCG/2ML IJ SOLN: 25.0000 ug | INTRAMUSCULAR | Status: DC | PRN

## 2022-04-04 MED ORDER — ACETAMINOPHEN 500 MG PO TABS
1000.0000 mg | ORAL_TABLET | ORAL | Status: AC
  Administered 2022-04-04: 1000 mg via ORAL

## 2022-04-04 MED ORDER — ACETAMINOPHEN 500 MG PO TABS
ORAL_TABLET | ORAL | Status: AC
  Filled 2022-04-04: qty 2

## 2022-04-04 MED ORDER — MIDAZOLAM HCL 2 MG/2ML IJ SOLN
INTRAMUSCULAR | Status: AC
  Filled 2022-04-04: qty 2

## 2022-04-04 MED ORDER — OXYCODONE HCL 5 MG PO TABS: 5.0000 mg | ORAL_TABLET | Freq: Once | ORAL | Status: DC | PRN

## 2022-04-04 MED ORDER — FENTANYL CITRATE (PF) 100 MCG/2ML IJ SOLN
INTRAMUSCULAR | Status: AC
  Filled 2022-04-04: qty 2

## 2022-04-04 MED ORDER — PROPOFOL 10 MG/ML IV BOLUS
INTRAVENOUS | Status: AC
  Filled 2022-04-04: qty 20

## 2022-04-04 MED ORDER — ONDANSETRON HCL 4 MG/2ML IJ SOLN
INTRAMUSCULAR | Status: DC | PRN
  Administered 2022-04-04: 4 mg via INTRAVENOUS

## 2022-04-04 MED ORDER — ACETAMINOPHEN 500 MG PO TABS: 1000.0000 mg | ORAL_TABLET | Freq: Once | ORAL | Status: DC | PRN

## 2022-04-04 MED ORDER — LACTATED RINGERS IV SOLN: INTRAVENOUS | Status: DC

## 2022-04-04 MED ORDER — PHENYLEPHRINE 80 MCG/ML (10ML) SYRINGE FOR IV PUSH (FOR BLOOD PRESSURE SUPPORT)
PREFILLED_SYRINGE | INTRAVENOUS | Status: DC | PRN
  Administered 2022-04-04: 80 ug via INTRAVENOUS
  Administered 2022-04-04: 160 ug via INTRAVENOUS

## 2022-04-04 MED ORDER — MIDAZOLAM HCL 2 MG/2ML IJ SOLN
INTRAMUSCULAR | Status: DC | PRN
  Administered 2022-04-04: 2 mg via INTRAVENOUS

## 2022-04-04 MED ORDER — ACETAMINOPHEN 160 MG/5ML PO SOLN: 1000.0000 mg | Freq: Once | ORAL | Status: DC | PRN

## 2022-04-04 MED ORDER — DEXAMETHASONE SODIUM PHOSPHATE 10 MG/ML IJ SOLN
INTRAMUSCULAR | Status: DC | PRN
  Administered 2022-04-04: 10 mg via INTRAVENOUS

## 2022-04-04 MED ORDER — SODIUM CHLORIDE 0.9 % IR SOLN
Status: DC | PRN
  Administered 2022-04-04: 3000 mL

## 2022-04-04 MED ORDER — FENTANYL CITRATE (PF) 250 MCG/5ML IJ SOLN
INTRAMUSCULAR | Status: DC | PRN
  Administered 2022-04-04: 50 ug via INTRAVENOUS

## 2022-04-04 MED ORDER — KETOROLAC TROMETHAMINE 30 MG/ML IJ SOLN
INTRAMUSCULAR | Status: DC | PRN
  Administered 2022-04-04: 30 mg via INTRAVENOUS

## 2022-04-04 SURGICAL SUPPLY — 14 items
DEVICE MYOSURE REACH (MISCELLANEOUS) IMPLANT
GLOVE BIOGEL PI IND STRL 6 (GLOVE) IMPLANT
GLOVE BIOGEL PI IND STRL 6.5 (GLOVE) IMPLANT
GLOVE SURG SS PI 6.5 STRL IVOR (GLOVE) IMPLANT
GOWN STRL REUS W/TWL LRG LVL3 (GOWN DISPOSABLE) ×1 IMPLANT
HIBICLENS CHG 4% 4OZ BTL (MISCELLANEOUS) IMPLANT
IV NS IRRIG 3000ML ARTHROMATIC (IV SOLUTION) ×1 IMPLANT
KIT PROCEDURE FLUENT (KITS) ×1 IMPLANT
KIT TURNOVER CYSTO (KITS) ×1 IMPLANT
PACK VAGINAL MINOR WOMEN LF (CUSTOM PROCEDURE TRAY) ×1 IMPLANT
PAD OB MATERNITY 4.3X12.25 (PERSONAL CARE ITEMS) ×1 IMPLANT
PAD PREP 24X48 CUFFED NSTRL (MISCELLANEOUS) ×1 IMPLANT
SEAL ROD LENS SCOPE MYOSURE (ABLATOR) ×1 IMPLANT
TOWEL OR 17X26 10 PK STRL BLUE (TOWEL DISPOSABLE) ×1 IMPLANT

## 2022-04-04 NOTE — Discharge Instructions (Signed)
     No acetaminophen/Tylenol until after 12:15 pm today if needed.     No ibuprofen, Advil, Aleve, Motrin, ketorolac, meloxicam, naproxen, or other NSAIDS until after 1:55 pm today if needed.     Post Anesthesia Home Care Instructions  Activity: Get plenty of rest for the remainder of the day. A responsible individual must stay with you for 24 hours following the procedure.  For the next 24 hours, DO NOT: -Drive a car -Paediatric nurse -Drink alcoholic beverages -Take any medication unless instructed by your physician -Make any legal decisions or sign important papers.  Meals: Start with liquid foods such as gelatin or soup. Progress to regular foods as tolerated. Avoid greasy, spicy, heavy foods. If nausea and/or vomiting occur, drink only clear liquids until the nausea and/or vomiting subsides. Call your physician if vomiting continues.  Special Instructions/Symptoms: Your throat may feel dry or sore from the anesthesia or the breathing tube placed in your throat during surgery. If this causes discomfort, gargle with warm salt water. The discomfort should disappear within 24 hours.

## 2022-04-04 NOTE — Op Note (Signed)
Preoperative Diagnosis: postmenopausal bleeding  Postoperative Diagnosis: postmenopausal bleeding, endometrial polyp  Procedure: Hysteroscopy, polypectomy, dilation and curettage  Surgeon: Dr Sumner Boast  Assistants: None  Anesthesia: General via LMA  EBL: 5 cc  Fluids: 300 cc LR  Fluid deficit: 115 cc  Urine output: not recorded  Indications for surgery: The patient is a 61 yo female, who presented with postmenopausal bleeding. Work up included a benign endometrial biopsy, a normal pap smear and an ultrasound with a thickened endometrial stripe concerning for an endometrial polyp.  The risks of the surgery were reviewed with the patient and the consent form was signed prior to her surgery.  Findings: slightly enlarged, irregular, anteverted uterus. Hysteroscopy: large endometrial polyp, otherwise thin endometrium, normal tubal ostia bilaterally.   Specimens: endometrial polyp, endometrial curettage.    Procedure: The patient was taken to the operating room with an IV in place. She was placed in the dorsal lithotomy position and anesthesia was administered. She was prepped and draped in the usual sterile fashion for a vaginal procedure. She voided on the way to the OR. A weighted speculum was placed in the vagina and a single tooth tenaculum was placed on the anterior lip of the cervix. The cervix was dilated to a #7 hagar dilator. The uterus was sounded to 7-8 cm. The myosure hysteroscope was inserted into the uterine cavity. With continuous infusion of normal saline, the uterine cavity was visualized with the above findings. The myosure reach was used to resect the polyp. The myosure was then removed. The cavity was then curetted with the small sharp curette. The cavity had the characteristically gritty texture at the end of the procedure. The curette and the single tooth tenaculum were removed. Oozing from the tenaculum site was stopped with pressure. The speculum was removed. The  patients perineum was cleansed of betadine and she was taken out of the dorsal lithotomy position.  Upon awakening the LMA was removed and the patient was transferred to the recovery room in stable and awake condition.  The sponge and instrument count were correct.

## 2022-04-04 NOTE — Anesthesia Preprocedure Evaluation (Addendum)
Anesthesia Evaluation  Patient identified by MRN, date of birth, ID band Patient awake    Reviewed: Allergy & Precautions, NPO status , Patient's Chart, lab work & pertinent test results  History of Anesthesia Complications Negative for: history of anesthetic complications  Airway Mallampati: II  TM Distance: >3 FB Neck ROM: Full    Dental  (+) Dental Advisory Given, Caps, Teeth Intact   Pulmonary neg shortness of breath, neg sleep apnea, neg COPD, neg recent URI, former smoker,    breath sounds clear to auscultation       Cardiovascular (-) angina(-) Past MI and (-) CHF negative cardio ROS   Rhythm:Regular     Neuro/Psych negative neurological ROS  negative psych ROS   GI/Hepatic Neg liver ROS, GERD  Medicated and Controlled,  Endo/Other  negative endocrine ROS  Renal/GU negative Renal ROS     Musculoskeletal  (+) Arthritis ,   Abdominal   Peds  Hematology negative hematology ROS (+) Lab Results      Component                Value               Date                      WBC                      4.7                 02/15/2022                HGB                      13.0                02/15/2022                HCT                      38.8                02/15/2022                MCV                      93.0                02/15/2022                PLT                      220                 02/15/2022              Anesthesia Other Findings   Reproductive/Obstetrics                            Anesthesia Physical Anesthesia Plan  ASA: 2  Anesthesia Plan: General   Post-op Pain Management: Toradol IV (intra-op)* and Tylenol PO (pre-op)*   Induction: Intravenous  PONV Risk Score and Plan: 3 and Ondansetron and Dexamethasone  Airway Management Planned: LMA and Oral ETT  Additional Equipment: None  Intra-op Plan:   Post-operative Plan: Extubation in OR  Informed Consent: I  have reviewed the patients History  and Physical, chart, labs and discussed the procedure including the risks, benefits and alternatives for the proposed anesthesia with the patient or authorized representative who has indicated his/her understanding and acceptance.     Dental advisory given  Plan Discussed with: CRNA  Anesthesia Plan Comments:        Anesthesia Quick Evaluation

## 2022-04-04 NOTE — Interval H&P Note (Signed)
History and Physical Interval Note:  04/04/2022 7:09 AM  Haley Santana  has presented today for surgery, with the diagnosis of postmenopausal bleeding.  The various methods of treatment have been discussed with the patient and family. After consideration of risks, benefits and other options for treatment, the patient has consented to  Procedure(s): [place] (N/A) as a surgical intervention.  The patient's history has been reviewed, patient examined, no change in status, stable for surgery.  I have reviewed the patient's chart and labs.  Questions were answered to the patient's satisfaction.     Salvadore Dom

## 2022-04-04 NOTE — Transfer of Care (Signed)
Immediate Anesthesia Transfer of Care Note  Patient: Haley Santana  Procedure(s) Performed: DILATATION & CURETTAGE/HYSTEROSCOPY WITH MYOSURE (Uterus)  Patient Location: PACU  Anesthesia Type:General  Level of Consciousness: awake, alert  and oriented  Airway & Oxygen Therapy: Patient Spontanous Breathing  Post-op Assessment: Report given to RN and Post -op Vital signs reviewed and stable  Post vital signs: Reviewed and stable  Last Vitals:  Vitals Value Taken Time  BP 130/70 04/04/22 0808  Temp    Pulse 89 04/04/22 0811  Resp 12 04/04/22 0811  SpO2 95 % 04/04/22 0811  Vitals shown include unvalidated device data.  Last Pain:  Vitals:   04/04/22 0551  TempSrc: Oral         Complications: No notable events documented.

## 2022-04-04 NOTE — Anesthesia Postprocedure Evaluation (Signed)
Anesthesia Post Note  Patient: Haley Santana  Procedure(s) Performed: DILATATION & CURETTAGE/HYSTEROSCOPY WITH MYOSURE (Uterus)     Patient location during evaluation: PACU Anesthesia Type: General Level of consciousness: awake and alert Pain management: pain level controlled Vital Signs Assessment: post-procedure vital signs reviewed and stable Respiratory status: spontaneous breathing, nonlabored ventilation and respiratory function stable Cardiovascular status: blood pressure returned to baseline and stable Postop Assessment: no apparent nausea or vomiting Anesthetic complications: no   No notable events documented.  Last Vitals:  Vitals:   04/04/22 0830 04/04/22 0845  BP: (!) 130/97 138/83  Pulse: 69 61  Resp: 19 19  Temp:    SpO2: 97% 99%    Last Pain:  Vitals:   04/04/22 0845  TempSrc:   PainSc: 0-No pain                 Ameila Weldon

## 2022-04-04 NOTE — Anesthesia Procedure Notes (Signed)
Procedure Name: LMA Insertion Date/Time: 04/04/2022 7:39 AM  Performed by: Clearnce Sorrel, CRNAPre-anesthesia Checklist: Patient identified, Emergency Drugs available, Suction available and Patient being monitored Patient Re-evaluated:Patient Re-evaluated prior to induction Oxygen Delivery Method: Circle System Utilized Preoxygenation: Pre-oxygenation with 100% oxygen Induction Type: IV induction Ventilation: Mask ventilation without difficulty LMA: LMA inserted LMA Size: 4.0 Number of attempts: 1 Airway Equipment and Method: Bite block Placement Confirmation: positive ETCO2 Tube secured with: Tape Dental Injury: Teeth and Oropharynx as per pre-operative assessment

## 2022-04-05 ENCOUNTER — Encounter (HOSPITAL_BASED_OUTPATIENT_CLINIC_OR_DEPARTMENT_OTHER): Payer: Self-pay | Admitting: Obstetrics and Gynecology

## 2022-04-05 LAB — SURGICAL PATHOLOGY

## 2022-04-06 ENCOUNTER — Telehealth: Payer: Self-pay

## 2022-04-06 NOTE — Telephone Encounter (Signed)
Patient was informed of Dr. Gentry Fitz reply. Patient said diarrhea bout every hour.  She said she could not say if the cramping is causing the diarrhea but she did say she stopped eating and did not have cramping or diarrhea but when she started back eating it started up again. She had only taken Tylenol.  She has no severe pain and no fever and she said she would like to observe for another day and see how it progresses. She will call prn or schedule visit for tomorrow.

## 2022-04-06 NOTE — Telephone Encounter (Signed)
This is not a normal post op course after a hysteroscopy.  How often is she having diarrhea? Does the diarrhea seem to be causing the cramping? Has she taken anything for the discomfort? I wonder if she is reacting to some medication that she got from Anesthesia. I'm happy to see her this afternoon if she wants to come in. If she develops a fever or is having severe pain she must be seen.

## 2022-04-06 NOTE — Telephone Encounter (Signed)
I called patient and informed her of result.  She states she is "feeling miserable".  C/O menstrual like cramping and diarrhea. "My face is redder than a beet".  No fever as her daughter is checking on her temp.   I told her I will report this to Dr. Talbert Nan and call her if anything to advise her.

## 2022-04-06 NOTE — Telephone Encounter (Signed)
Please call and check on her in the am

## 2022-04-06 NOTE — Telephone Encounter (Signed)
-----   Message from Salvadore Dom, MD sent at 04/06/2022 11:13 AM EDT ----- 2 days s/p hysteroscopy, please call and check on her and let her know that her pathology is benign

## 2022-04-07 NOTE — Telephone Encounter (Signed)
Patient informed. 

## 2022-04-07 NOTE — Telephone Encounter (Signed)
I called to check on patient. At beginning of call she said she was better.  However, she went onto say that she is still cramping and it kept her awake during the night.  She was able to eat yesterday afternoon and held the food for 6 hours and then had diarrhea about 6pm. That was the last time she had diarrhea. That was the last time she ate, She is able to hold Gatorade and water and she is drinking.  She is currently cooking some potatoes and is going to try to eat them and see if it causes diarrhea.  Still no fever. Reports she feels "tired and dizzy".

## 2022-04-07 NOTE — Telephone Encounter (Signed)
It sounds like she is either reacting to something or has a GI bug. Given the large GI component to her c/o, I would recommend that she f/u with her primary if she isn't continuing to improve.

## 2022-04-14 ENCOUNTER — Telehealth: Payer: Self-pay

## 2022-04-14 NOTE — Telephone Encounter (Signed)
Call placed to patient after receiving a call from Kathrynn Ducking  FNP's office about patient ultrasound that was done on 04/14/22. Nurse states that patient was not sure about why she had [place]. I explained to the patient that Dr. Talbert Nan removed a polyp during the procedure and that fibroids can not be seen during hysteroscopy. I explained that her fibroids that are visible on ultrasound are known to Dr. Talbert Nan and are tiny and in the wall on her uterus. Patient verbalized understanding.

## 2022-04-18 ENCOUNTER — Encounter: Payer: Self-pay | Admitting: Obstetrics and Gynecology

## 2022-04-18 ENCOUNTER — Ambulatory Visit (INDEPENDENT_AMBULATORY_CARE_PROVIDER_SITE_OTHER): Payer: Medicare Other | Admitting: Obstetrics and Gynecology

## 2022-04-18 VITALS — BP 126/62 | HR 66 | Wt 165.0 lb

## 2022-04-18 DIAGNOSIS — R1030 Lower abdominal pain, unspecified: Secondary | ICD-10-CM | POA: Diagnosis not present

## 2022-04-18 DIAGNOSIS — Z9889 Other specified postprocedural states: Secondary | ICD-10-CM | POA: Diagnosis not present

## 2022-04-18 NOTE — Progress Notes (Signed)
GYNECOLOGY  VISIT   HPI: 61 y.o.   Single White or Caucasian Not Hispanic or Latino  female   [phone] with No LMP recorded. Patient is postmenopausal.   here for a post operative visit. She is 2 weeks s/p hysteroscopy, polypectomy, D&C for PMP bleeding. Pathology with a benign polyp.   She has a known fibroid uterus, both fibroids are subserosal, largest was just over 4 cm. No intracavitary fibroids.   She is having nausea and its not every time she eats. She states that her stools have changed. She states that today she had chills. She has been having similar symptoms for a long time.   She wakes up with pain in BLQ/bilateral groin.  BM's are thin and floating. She has a BM 3-4 x a day. Some bloating. She has seen GI.   GYNECOLOGIC HISTORY: No LMP recorded. Patient is postmenopausal. Contraception:PMP Menopausal hormone therapy: No        OB History     Gravida  6   Para  2   Term  2   Preterm      AB  4   Living  2      SAB  2   IAB  2   Ectopic      Multiple      Live Births                 Patient Active Problem List   Diagnosis Date Noted   Gastritis 12/13/2021   Hardening of the aorta (main artery of the heart) ([facility]) 12/13/2021   Pain in pelvis 12/13/2021   Pain in limb 12/13/2021   Recurrent major depression ([place]) 12/13/2021   LLQ abdominal pain 12/08/2021   Nausea without vomiting 12/08/2021   Breast cancer of upper-outer quadrant of left female breast ([facility]) 08/03/2018    Past Medical History:  Diagnosis Date   Arthritis    Breast cancer ([place]) 02/20/2017   left; radiation only   Cancer ([place])    Diverticulitis    GERD (gastroesophageal reflux disease)    septic ulcers   H/O benign breast biopsy 1984   right   Hemorrhoids     Past Surgical History:  Procedure Laterality Date   BIOPSY  10/16/2020   Procedure: BIOPSY;  Surgeon: [name], DO;  [facility];  Service: Endoscopy;;   BREAST LUMPECTOMY      CHOLECYSTECTOMY     COLONOSCOPY WITH PROPOFOL N/A 10/16/2020   Procedure: COLONOSCOPY WITH PROPOFOL;  Surgeon: [name], DO;  [facility];  Service: Endoscopy;  Laterality: N/A;  PM   CYST EXCISION     DILATATION & CURETTAGE/HYSTEROSCOPY WITH MYOSURE N/A 04/04/2022   Procedure: DILATATION & CURETTAGE/HYSTEROSCOPY WITH MYOSURE;  Surgeon: Salvadore Dom, MD;  Location: [facility];  Service: Gynecology;  Laterality: N/A;   ESOPHAGOGASTRODUODENOSCOPY (EGD) WITH PROPOFOL N/A 10/16/2020   Procedure: ESOPHAGOGASTRODUODENOSCOPY (EGD) WITH PROPOFOL;  Surgeon: [name], DO;  [facility];  Service: Endoscopy;  Laterality: N/A;   HERNIA REPAIR      Current Outpatient Medications  Medication Sig Dispense Refill   omeprazole (PRILOSEC) 20 MG capsule Take 1 capsule (20 mg total) by mouth 2 (two) times daily before a meal. 180 capsule 3   No current facility-administered medications for this visit.     ALLERGIES: Banana flavor, Dust mite extract, Fluoride preparations, Other, Shellfish allergy, and Latex  Family History  Problem Relation Age of Onset  Hypertension Mother    Cancer Mother    Cancer Father    Cancer Sister    Diabetes Brother    Cancer Brother    Cancer Maternal Aunt        Ovarian   Breast cancer Maternal Aunt     Social History   Socioeconomic History   Marital status: Single    Spouse name: Not on file   Number of children: Not on file   Years of education: Not on file   Highest education level: Not on file  Occupational History   Not on file  Tobacco Use   Smoking status: Former    Passive exposure: Past   Smokeless tobacco: Never  Vaping Use   Vaping Use: Never used  Substance and Sexual Activity   Alcohol use: Yes    Comment: 1-2 times per year   Drug use: Never   Sexual activity: Not Currently    Partners: Male    Birth control/protection: Post-menopausal  Other Topics Concern   Not on file   Social History Narrative   Not on file   Social Determinants of Health   Financial Resource Strain: Low Risk  (02/02/2021)   Overall Financial Resource Strain (CARDIA)    Difficulty of Paying Living Expenses: Not hard at all  Food Insecurity: No Food Insecurity (02/02/2021)   Hunger Vital Sign    Worried About Running Out of Food in the Last Year: Never true    [place] in the Last Year: Never true  Transportation Needs: No Transportation Needs (02/02/2021)   PRAPARE - Hydrologist (Medical): No    Lack of Transportation (Non-Medical): No  Physical Activity: Insufficiently Active (02/02/2021)   Exercise Vital Sign    Days of Exercise per Week: 5 days    Minutes of Exercise per Session: 20 min  Stress: No Stress Concern Present (02/02/2021)   [place]    Feeling of Stress : Not at all  Social Connections: Socially Isolated (02/02/2021)   Social Connection and Isolation Panel [NHANES]    Frequency of Communication with Friends and Family: More than three times a week    Frequency of Social Gatherings with Friends and Family: More than three times a week    Attends Religious Services: Never    Marine scientist or Organizations: No    Attends Archivist Meetings: Never    Marital Status: Divorced  Human resources officer Violence: Not At Risk (02/02/2021)   Humiliation, Afraid, Rape, and Kick questionnaire    Fear of Current or Ex-Partner: No    Emotionally Abused: No    Physically Abused: No    Sexually Abused: No    Review of Systems  All other systems reviewed and are negative.   PHYSICAL EXAMINATION:    BP 126/62   Pulse 66   Wt 165 lb (74.8 kg)   SpO2 100%   BMI 27.46 kg/m     General appearance: alert, cooperative and appears stated age  Reviewed hysteroscopy images, pathology report and her prior u/s to help explain the difference between polyps and  fibroids. Explained that her fibroids should not be causing any problems for her and don't explain her pain.  1. History of hysteroscopy Benign polyp removed.  Routine f/u.   2. Lower abdominal pain Long term. I recommended she f/u with primary and GI. She has 4-5 thin, soft BM's a day that  float.   Over 20 minutes spent in total patient care.

## 2022-05-03 ENCOUNTER — Telehealth: Payer: Self-pay | Admitting: *Deleted

## 2022-05-03 NOTE — Telephone Encounter (Signed)
Patient called to ask when last pap smear I informed patient 12/2021. She asked when will next pap be due? She prefers to come every 2 years for Gyn visit. She asked if that is possible? Please advise

## 2022-05-03 NOTE — Telephone Encounter (Signed)
Yes, every 2 years is fine. She should come for a breast and pelvic exam in 7/25, sooner with any concerns.

## 2022-05-03 NOTE — Telephone Encounter (Signed)
Patient informed. 

## 2022-05-17 ENCOUNTER — Inpatient Hospital Stay: Payer: Medicare Other | Attending: Hematology | Admitting: Physician Assistant

## 2022-05-17 ENCOUNTER — Other Ambulatory Visit: Payer: Self-pay

## 2022-05-17 VITALS — BP 141/83 | HR 85 | Temp 98.0°F | Resp 18 | Ht 65.0 in | Wt 164.0 lb

## 2022-05-17 DIAGNOSIS — Z1231 Encounter for screening mammogram for malignant neoplasm of breast: Secondary | ICD-10-CM

## 2022-05-17 DIAGNOSIS — Z170 Estrogen receptor positive status [ER+]: Secondary | ICD-10-CM | POA: Insufficient documentation

## 2022-05-17 DIAGNOSIS — Z79811 Long term (current) use of aromatase inhibitors: Secondary | ICD-10-CM

## 2022-05-17 DIAGNOSIS — C50412 Malignant neoplasm of upper-outer quadrant of left female breast: Secondary | ICD-10-CM | POA: Insufficient documentation

## 2022-05-17 MED ORDER — ANASTROZOLE 1 MG PO TABS: 1.0000 mg | ORAL_TABLET | Freq: Every day | ORAL | 11 refills | Status: DC

## 2022-05-17 NOTE — Progress Notes (Signed)
[facility] [address] [place] [address]   CLINIC:  Medical Oncology/Hematology  PCP:  Gweneth Fritter, FNP [address] [place] [address] [phone]   REASON FOR VISIT:  Follow-up for stage I left breast cancer   PRIOR THERAPY: - Lumpectomy/lymph node biopsy (01/25/2017) - Partial breast radiation (completed October 2018)   NGS Results: [facility] myrisk-heterozygosity for MUTYH with slightly increased risk for colon cancer   CURRENT THERAPY: - Tamoxifen (October 2018 through August 2023)  BRIEF ONCOLOGIC HISTORY:  Oncology History  Breast cancer of upper-outer quadrant of left female breast ([facility])  08/03/2018 Initial Diagnosis   Breast cancer of upper-outer quadrant of left female breast ([facility])   08/03/2018 Cancer Staging   Staging form: Breast, AJCC 8th Edition - Clinical stage from 08/03/2018: Stage IA (cT1a, cN0, cM0, G1, ER+, PR+, HER2-) - Signed by Derek Jack, MD on 08/03/2018     CANCER STAGING: Cancer Staging  Breast cancer of upper-outer quadrant of left female breast [facility]) Staging form: Breast, AJCC 8th Edition - Clinical stage from 08/03/2018: Stage IA (cT1a, cN0, cM0, G1, ER+, PR+, HER2-) - Signed by Derek Jack, MD on 08/03/2018   INTERVAL HISTORY:  Haley Santana, a 61 y.o. female, returns for routine follow-up of her history of left-sided breast cancer. Santos was last seen on 02/23/2022 by Tarri Abernethy PA-C.   At today's visit, she  reports feeling fair.  She had D&C in October 2023, and has had some ongoing back pain, pelvic pain, nausea, and diarrhea.  She denies any symptoms of recurrence such as new lumps, bone pain, chest pain, dyspnea.  She has no new headaches, seizures, or focal neurologic deficits.  No B symptoms such as fever, night sweats, unintentional weight loss.  She does report intermittent chills and hot flashes at night.   She stopped taking tamoxifen on 01/20/2022 because she  "hates it," particularly the bad taste that it leaves in her mouth after she takes it.  She had also reported an episode of pelvic pain and vaginal bleeding earlier this year.  Pelvic ultrasound obtained by GYN (Dr. Sumner Boast)  revealed 2 subserosal uterine fibroids, asymmetrically thickened endometrium, and 3D imaging suggestive of a 1.3 cm intracavity mass with feeder vessel.  Endometrial biopsy was negative for malignancy.  Hysteroscopy with D&C/polypectomy by Dr. Talbert Nan on 04/04/2022 revealed large endometrial polyp (benign endometrial polyp negative for hyperplasia or malignancy).  She continues to have pelvic pain after her D&C, but denies any recurrent vaginal bleeding.   Patient reports that she is not sure if she has gone through menopause.  She reports that she was having periods at the time of her breast cancer diagnosis, therefore placed on tamoxifen.  Her periods stopped when she started tamoxifen.  Additional labs obtained by Dr. Talbert Nan (GYN) on 03/09/2022 confirm menopausal hormone levels.  She reports 50% energy and 100% appetite.  She is maintaining stable weight at this time.    REVIEW OF SYSTEMS: Review of Systems  Constitutional:  Positive for fatigue. Negative for appetite change, chills, diaphoresis, fever and unexpected weight change.  HENT:   Negative for lump/mass and nosebleeds.   Eyes:  Negative for eye problems.  Respiratory:  Negative for cough, hemoptysis and shortness of breath.   Cardiovascular:  Negative for chest pain, leg swelling and palpitations.  Gastrointestinal:  Positive for diarrhea and nausea. Negative for abdominal pain, blood in stool, constipation and vomiting.  Genitourinary:  Positive  for difficulty urinating (after D&C) and pelvic pain. Negative for hematuria and vaginal bleeding.   Skin: Negative.   Neurological:  Positive for dizziness, headaches and numbness (feet). Negative for light-headedness.  Hematological:  Does not bruise/bleed easily.   Psychiatric/Behavioral:  Positive for depression (improved) and sleep disturbance (from pain). The patient is nervous/anxious.     PAST MEDICAL/SURGICAL HISTORY:  Past Medical History:  Diagnosis Date   Arthritis    Breast cancer ([facility]) 02/20/2017   left; radiation only   Cancer ([facility])    Diverticulitis    GERD (gastroesophageal reflux disease)    septic ulcers   H/O benign breast biopsy 1984   right   Hemorrhoids    Past Surgical History:  Procedure Laterality Date   BIOPSY  10/16/2020   Procedure: BIOPSY;  Surgeon: [name], DO;  [facility];  Service: Endoscopy;;   BREAST LUMPECTOMY     CHOLECYSTECTOMY     COLONOSCOPY WITH PROPOFOL N/A 10/16/2020   Procedure: COLONOSCOPY WITH PROPOFOL;  Surgeon: [name], DO;  [facility];  Service: Endoscopy;  Laterality: N/A;  PM   CYST EXCISION     DILATATION & CURETTAGE/HYSTEROSCOPY WITH MYOSURE N/A 04/04/2022   Procedure: DILATATION & CURETTAGE/HYSTEROSCOPY WITH MYOSURE;  Surgeon: Salvadore Dom, MD;  Location: [facility];  Service: Gynecology;  Laterality: N/A;   ESOPHAGOGASTRODUODENOSCOPY (EGD) WITH PROPOFOL N/A 10/16/2020   Procedure: ESOPHAGOGASTRODUODENOSCOPY (EGD) WITH PROPOFOL;  Surgeon: [name], DO;  [facility];  Service: Endoscopy;  Laterality: N/A;   HERNIA REPAIR      SOCIAL HISTORY:  Social History   Socioeconomic History   Marital status: Single    Spouse name: Not on file   Number of children: Not on file   Years of education: Not on file   Highest education level: Not on file  Occupational History   Not on file  Tobacco Use   Smoking status: Former    Passive exposure: Past   Smokeless tobacco: Never  Vaping Use   Vaping Use: Never used  Substance and Sexual Activity   Alcohol use: Yes    Comment: 1-2 times per year   Drug use: Never   Sexual activity: Not Currently    Partners: Male    Birth control/protection:  Post-menopausal  Other Topics Concern   Not on file  Social History Narrative   Not on file   Social Determinants of Health   Financial Resource Strain: Low Risk  (02/02/2021)   Overall Financial Resource Strain (CARDIA)    Difficulty of Paying Living Expenses: Not hard at all  Food Insecurity: No Food Insecurity (02/02/2021)   Hunger Vital Sign    Worried About Running Out of Food in the Last Year: Never true    [place] in the Last Year: Never true  Transportation Needs: No Transportation Needs (02/02/2021)   PRAPARE - Hydrologist (Medical): No    Lack of Transportation (Non-Medical): No  Physical Activity: Insufficiently Active (02/02/2021)   Exercise Vital Sign    Days of Exercise per Week: 5 days    Minutes of Exercise per Session: 20 min  Stress: No Stress Concern Present (02/02/2021)   [employer]    Feeling of Stress : Not at all  Social Connections: Socially Isolated (02/02/2021)   Social Connection and Isolation Panel [NHANES]    Frequency of Communication with Friends  and Family: More than three times a week    Frequency of Social Gatherings with Friends and Family: More than three times a week    Attends Religious Services: Never    Marine scientist or Organizations: No    Attends Archivist Meetings: Never    Marital Status: Divorced  Human resources officer Violence: Not At Risk (02/02/2021)   Humiliation, Afraid, Rape, and Kick questionnaire    Fear of Current or Ex-Partner: No    Emotionally Abused: No    Physically Abused: No    Sexually Abused: No    FAMILY HISTORY:  Family History  Problem Relation Age of Onset   Hypertension Mother    Cancer Mother    Cancer Father    Cancer Sister    Diabetes Brother    Cancer Brother    Cancer Maternal Aunt        Ovarian   Breast cancer Maternal Aunt     CURRENT MEDICATIONS:  Current Outpatient  Medications  Medication Sig Dispense Refill   omeprazole (PRILOSEC) 20 MG capsule Take 1 capsule (20 mg total) by mouth 2 (two) times daily before a meal. 180 capsule 3   No current facility-administered medications for this visit.    ALLERGIES:  Allergies  Allergen Reactions   Banana Flavor     Other reaction(s): Unknown   Dust Mite Extract Other (See Comments)    Per allergy test   Fluoride Preparations Itching    Caused throat to feel itchy   Other     Tree nuts--throat swelling/develops sores   Shellfish Allergy Swelling   Latex Rash and Other (See Comments)    Oral bumps/lesions    PHYSICAL EXAM:  Performance status (ECOG): 1 - Symptomatic but completely ambulatory  There were no vitals filed for this visit. Wt Readings from Last 3 Encounters:  04/18/22 165 lb (74.8 kg)  04/04/22 163 lb (73.9 kg)  03/09/22 163 lb (73.9 kg)   Physical Exam Constitutional:      Appearance: Normal appearance.  HENT:     Head: Normocephalic and atraumatic.     Mouth/Throat:     Mouth: Mucous membranes are moist.  Eyes:     Extraocular Movements: Extraocular movements intact.     Pupils: Pupils are equal, round, and reactive to light.  Cardiovascular:     Rate and Rhythm: Normal rate and regular rhythm.     Pulses: Normal pulses.     Heart sounds: Normal heart sounds.  Pulmonary:     Effort: Pulmonary effort is normal.     Breath sounds: Normal breath sounds.  Chest:     Comments: **BREAST EXAM DEFERRED TODAY - SEE NOTE FROM 02/23/2022 FOR MOST RECENT BREAST EXAM Abdominal:     General: Bowel sounds are normal.     Palpations: Abdomen is soft.     Tenderness: There is no abdominal tenderness.  Musculoskeletal:        General: No swelling.     Right lower leg: No edema.     Left lower leg: No edema.  Lymphadenopathy:     Cervical: No cervical adenopathy.  Skin:    General: Skin is warm and dry.  Neurological:     General: No focal deficit present.     Mental Status: She  is alert and oriented to person, place, and time.  Psychiatric:        Mood and Affect: Mood normal.        Behavior: Behavior normal.  LABORATORY DATA:  I have reviewed the labs as listed.     Latest Ref Rng & Units 02/15/2022   10:27 AM 01/26/2021   10:27 AM 01/21/2020    9:02 AM  CBC  WBC 4.0 - 10.5 K/uL 4.7  5.8  5.1   Hemoglobin 12.0 - 15.0 g/dL 13.0  12.7  12.9   Hematocrit 36.0 - 46.0 % 38.8  38.3  39.7   Platelets 150 - 400 K/uL 220  239  226       Latest Ref Rng & Units 02/15/2022   10:27 AM 01/26/2021   10:27 AM 01/21/2020    9:02 AM  CMP  Glucose 70 - 99 mg/dL 91  112  95   BUN 8 - 23 mg/dL _0 Creatinine 0.44 - 1.00 mg/dL 0.83  0.91  0.90   Sodium 135 - 145 mmol/L 141  137  140   Potassium 3.5 - 5.1 mmol/L 4.0  3.8  4.3   Chloride 98 - 111 mmol/L 110  105  106   CO2 22 - 32 mmol/L _1 Calcium 8.9 - 10.3 mg/dL 8.9  8.9  9.1   Total Protein 6.5 - 8.1 g/dL 7.0  6.7  6.8   Total Bilirubin 0.3 - 1.2 mg/dL 0.6  0.3  0.5   Alkaline Phos 38 - 126 U/L 55  47  42   AST 15 - 41 U/L _2 ALT 0 - 44 U/L _3 DIAGNOSTIC IMAGING:  I have independently reviewed the scans and discussed with the patient. No results found.   ASSESSMENT & PLAN: 1.  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 - Partial breast radiation therapy dose of 34 Gray at 3.4GY per fraction given twice daily 6 hours apart in 10 fractions completed on 03/24/2017 at [facility] myrisk-heterozygosity for MUTYH with slightly increased risk for colon cancer. (Colonoscopy in 2019 reportedly negative.) - Tamoxifen from October 2018 through August 2023.  Patient self discontinued tamoxifen on 01/20/2022. - BCI testing (03/02/2022)  showed benefit from extended endocrine therapy (6.3% risk of late distant recurrence with 5 years of adjuvant endocrine therapy, versus 2.1 to 2.6% risk of late distant recurrence after 10 years of adjuvant endocrine therapy) - Patient reports that she stopped having periods when she started tamoxifen, was unsure if she had gone through menopause.  Labs obtained by GYN on 03/09/2022 confirmed postmenopausal status. - Most recent mammogram (02/15/2022): Stable lumpectomy changes in the left breast, but no mammographic evidence of malignancy in either breast. - Labs (02/15/2022) showed normal CBC, normal CMP, normal LDH, normal vitamin D. - Physical exam on 02/23/2022 showed mild lymphedema of left lower breast tissue.  No discrete nodules, masses, or lymphadenopathy of either breast. - ROS negative for any symptoms of recurrent breast cancer at this time. - PLAN: BCI testing predicts benefit from extended (10-year) endocrine therapy. - Since patient is confirmed postmenopausal, recommend starting anastrozole to complete additional 5 years of therapy (completed almost 5-years of tamoxifen but self discontinued in August 2023).  We have discussed risks and side effects, and patient is agreeable to proceeding with treatment. - RTC in September  2024 with repeat labs, screening mammogram, and office visit    2.  Bone health - DEXA scan completed 12/2018 showed normal bone density. - Recommended patient to continue vitamin D and calcium daily.  - PLAN: Check DEXA/bone density scan since we are starting patient on aromatase inhibitor.     3.  Pelvic pain and postmenstrual bleeding - Reported pelvic pain and vaginal bleeding in summer/fall 2023 - Work-up by gynecologist including pelvic ultrasound revealed 2 subserosal uterine fibroids, asymmetrically thickened endometrium, and 3D imaging suggestive of a 1.3 cm intracavity mass with feeder vessel. - Endometrial biopsy was negative for malignancy. -Hysteroscopy  with D&C/polypectomy by Dr. Talbert Nan on 04/04/2022 revealed large endometrial polyp (benign endometrial polyp negative for hyperplasia or malignancy). - PLAN: Continue GYN follow-up for management to Dr. Talbert Nan.   PLAN SUMMARY: >> Prescription sent to pharmacy for anastrozole  >> Bone density scan >> Screening mammogram in September 2024 >> Labs (CBC/D, CMP, vitamin D) in September 2024 >> Office visit in September 2024 (1 week after labs/mammogram)   All questions were answered. The patient knows to call the clinic with any problems, questions or concerns.  Medical decision making: Moderate  Time spent on visit: I spent 20 minutes counseling the patient face to face. The total time spent in the appointment was 30 minutes and more than 50% was on counseling.   Harriett Rush, PA-C  05/17/2022 3:11 PM

## 2022-05-17 NOTE — Patient Instructions (Signed)
[place] at [place] **   You were seen today by Tarri Abernethy PA-C for your history of breast cancer.    Your lab test (Breast Cancer Index) showed that you would benefit from an additional 5 years of antiestrogen treatment. WITHOUT additional antiestrogen treatment you have a 6.3% risk of recurrent breast cancer. WITH additional antiestrogen treatment, you have a 2.1 to 2.6% risk of recurrent breast cancer. Since your gynecology labs have confirmed that you are postmenopausal, I recommend the medication ANASTROZOLE (Arimidex) to be taken once daily for the next 5 years. Common side effects include hot flashes, joint and bone pain, and decreased bone mass (osteopenia and osteoporosis). Prescription has been sent to your pharmacy.  You can start taking this medication tomorrow. Please notify our office if you have any adverse side effects from starting this medication.  We will check BONE DENSITY SCAN within the next 1 to 2 months, since your breast cancer medication (anastrozole) can cause decreased bone mass.   LABS: Return in September 2024 for repeat labs.  OTHER TESTS: You will be due for your annual mammogram in September 2024.  FOLLOW-UP APPOINTMENT: Annual office visit and breast exam in September 2024 (after labs and mammogram).  Please let us know if you need an appointment sooner rather than later based on any other symptoms or clinical concerns.  ** Thank you for trusting me with your healthcare!  I strive to provide all of my patients with quality care at each visit.  If you receive a survey for this visit, I would be so grateful to you for taking the time to provide feedback.  Thank you in advance!  ~ Gaila Engebretsen                   Dr. Derek Jack   &   Tarri Abernethy, PA-C   - - - - - - - - - - - - - - - - - -    Thank you for choosing [place] at [facility] to  provide your oncology and hematology care.  To afford each patient quality time with our provider, please arrive at least 15 minutes before your scheduled appointment time.   If you have a lab appointment with the [facility] please come in thru the Main Entrance and check in at the main information desk.  You need to re-schedule your appointment should you arrive 10 or more minutes late.  We strive to give you quality time with our providers, and arriving late affects you and other patients whose appointments are after yours.  Also, if you no show three or more times for appointments you may be dismissed from the clinic at the providers discretion.     Again, thank you for choosing [facility].  Our hope is that these requests will decrease the amount of time that you wait before being seen by our physicians.       _____________________________________________________________  Should you have questions after your visit to [facility], please contact our office at [phone] and follow the prompts.  Our office hours are 8:00 a.m. and 4:30 p.m. Monday - Friday.  Please note that voicemails left after 4:00 p.m. may not be returned until the following business day.  We are closed weekends and major holidays.  You do have access to a nurse 24-7, just call the main number to the clinic  [phone] and do not press any options, hold on the line and a nurse will answer the phone.    For prescription refill requests, have your pharmacy contact our office and allow 72 hours.

## 2022-05-26 ENCOUNTER — Ambulatory Visit: Payer: Medicare Other | Admitting: Physician Assistant

## 2022-05-27 ENCOUNTER — Inpatient Hospital Stay (HOSPITAL_COMMUNITY): Admission: RE | Admit: 2022-05-27 | Payer: Medicare Other | Source: Ambulatory Visit

## 2022-06-02 ENCOUNTER — Encounter: Payer: Self-pay | Admitting: Hematology

## 2022-07-20 ENCOUNTER — Encounter: Payer: Self-pay | Admitting: Internal Medicine

## 2022-08-23 ENCOUNTER — Encounter: Payer: Self-pay | Admitting: Internal Medicine

## 2022-11-29 ENCOUNTER — Encounter: Payer: Self-pay | Admitting: Gastroenterology

## 2022-11-29 ENCOUNTER — Ambulatory Visit (INDEPENDENT_AMBULATORY_CARE_PROVIDER_SITE_OTHER): Payer: Medicare Other | Admitting: Gastroenterology

## 2022-11-29 VITALS — BP 119/74 | HR 78 | Temp 97.8°F | Ht 65.0 in | Wt 165.2 lb

## 2022-11-29 DIAGNOSIS — K625 Hemorrhage of anus and rectum: Secondary | ICD-10-CM

## 2022-11-29 DIAGNOSIS — K640 First degree hemorrhoids: Secondary | ICD-10-CM | POA: Diagnosis not present

## 2022-11-29 NOTE — Progress Notes (Signed)
   [facility] Banding Procedure Note:   Haley Santana is a 62 y.o. female presenting today for consideration of hemorrhoid banding. Last colonoscopy 10/16/2020 unremarkable besides internal hemorrhoids and diverticulosis. Random colon biopsies negative.  Latex Allergy: YES  Interval History: Seen by Dr. Marletta Lor in September 2023.  Bowels moving well.  Previously started on amitriptyline nightly but was unable to tolerate due to side effects.  Was having increased bleeding at the time with bright red blood with every bowel movement.  Rectal bleeding thought to be secondary to internal hemorrhoids.  Advised to have hemorrhoid banding done after looking over pamphlet if she decided to proceed.  She has seen [facility] for her abdominal pain.  This over pain secondary to abdominal infusions and therefore she was referred to general surgery.  She was advised that they do not perform hemorrhoid banding since she was advised to come back to [facility] for hemorrhoid banding.  She continues to experience releases small little chip of bleeding with bowel movements.  Usually has about 3 bowel movements per day and denies any significant straining.  She does report a history of lifting and squatting with work.  She does report her prior frustrations as well with not knowing where her abdominal pain is coming from.  The patient presents with symptomatic grade 1 hemorrhoids, unresponsive to maximal medical therapy, requesting rubber band ligation of his/her hemorrhoidal disease. All risks, benefits, and alternative forms of therapy were described and informed consent was obtained.  In the left lateral decubitus position anoscopic examination revealed grade 1 hemorrhoids in the right posterior and right anterior position, possible also left lateral but hard to visualize.   The decision was made to band the right posterior internal hemorrhoid and latex free bands were used, and the [facility] System was used to perform  band ligation without complication. Digital anorectal examination was then performed to assure proper positioning of the band, and to adjust the banded tissue as required. The patient was discharged home without pain or other issues. Dietary and behavioral recommendations were given, along with follow-up instructions. The patient will return in 2-3 weeks for followup and possible additional banding as required by me or Lewie Loron, NP  No complications were encountered and the patient tolerated the procedure well.   Brooke Bonito, MSN, FNP-BC, AGACNP-BC [facility]

## 2022-11-29 NOTE — Patient Instructions (Signed)
Continue to avoid straining (avoid heavy lifting and frequent squatting if able). Limit toilet time to 2-3 minutes at the most.   Avoid constipation. Take 2 tablespoons of natural wheat bran, natural oat bran, flax, Benefiber or any over the counter fiber supplement and increase your water intake to 7-8 glasses daily.  Occasionally, you may have more bleeding than usual after the banding procedure. This is often from the untreated hemorrhoids rather than the treated one. Don't be concerned if there is a tablespoon or so of blood. If there is more blood than this, lie flat with your bottom higher than your head and apply an ice pack to the area. If the bleeding does not stop within a half an hour or if you feel faint, have severe pain, chills, fever or difficulty passing urine (very rare) or other problems, you should call us at [phone] or report to the nearest emergency room. Please call me with any concerns!  The procedure you have had should have been relatively painless since the banding of the area involved does not have nerve endings and there is no pain sensation. The rubber band cuts off the blood supply to the hemorrhoid and the band may fall off as soon as 48 hours after the banding (the band may occasionally be seen in the toilet bowl following a bowel movement). You may notice a temporary feeling of fullness in the rectum which should respond adequately to plain Tylenol or Motrin.  I will see you back in 2-3 weeks for additional banding.   Brooke Bonito, MSN, FNP-BC, AGACNP-BC [facility]

## 2022-12-06 ENCOUNTER — Telehealth (INDEPENDENT_AMBULATORY_CARE_PROVIDER_SITE_OTHER): Payer: Self-pay | Admitting: *Deleted

## 2022-12-06 NOTE — Telephone Encounter (Signed)
Pt had hemorrhoid banding on 6/18. Pt said the bands fell off 1 -2 days after. She started having abdominal cramping, diarrhea, throbbing stinging pain in lower left side, a lot of nausea, has seen some blood in stool but states that has been going on, no fever, feels light headed at times, no sob. She is eating and drinking normal but states she gets hearburn when eating. States she gets hearburn when eating. States pain feels the same as when she has had diverticulitis in the past with the stinging.   [phone]

## 2022-12-06 NOTE — Telephone Encounter (Signed)
Discussed all with patient. She is in [place] and states it would be better for her to follow up for lab and ct with her pcp. States she will call in the morning. She declined zofran rx and she states she has enough omeprazole to take 40mg  bid. Pt was advised to go to ED if she starts to have severe pain, fever and not able to keep down liquids. She verbalized understanding and states she will let us know what pcp plan is for scan and labs.

## 2022-12-22 ENCOUNTER — Encounter: Payer: Medicare Other | Admitting: Gastroenterology

## 2023-02-20 ENCOUNTER — Inpatient Hospital Stay: Payer: Medicare Other | Attending: Hematology

## 2023-02-20 ENCOUNTER — Ambulatory Visit (HOSPITAL_COMMUNITY)
Admission: RE | Admit: 2023-02-20 | Discharge: 2023-02-20 | Disposition: A | Discharge status: Home / Self Care | Payer: Medicare Other | Source: Ambulatory Visit | Attending: Physician Assistant | Admitting: Physician Assistant

## 2023-02-20 DIAGNOSIS — Z79811 Long term (current) use of aromatase inhibitors: Secondary | ICD-10-CM | POA: Insufficient documentation

## 2023-02-20 DIAGNOSIS — C50412 Malignant neoplasm of upper-outer quadrant of left female breast: Secondary | ICD-10-CM | POA: Insufficient documentation

## 2023-02-20 DIAGNOSIS — Z1231 Encounter for screening mammogram for malignant neoplasm of breast: Secondary | ICD-10-CM | POA: Insufficient documentation

## 2023-02-20 DIAGNOSIS — Z853 Personal history of malignant neoplasm of breast: Secondary | ICD-10-CM | POA: Insufficient documentation

## 2023-02-20 DIAGNOSIS — Z170 Estrogen receptor positive status [ER+]: Secondary | ICD-10-CM | POA: Insufficient documentation

## 2023-02-20 DIAGNOSIS — Z780 Asymptomatic menopausal state: Secondary | ICD-10-CM | POA: Diagnosis not present

## 2023-02-20 DIAGNOSIS — Z13820 Encounter for screening for osteoporosis: Secondary | ICD-10-CM | POA: Diagnosis present

## 2023-02-20 LAB — COMPREHENSIVE METABOLIC PANEL
ALT: 29 U/L (ref 0–44)
AST: 24 U/L (ref 15–41)
Albumin: 3.8 g/dL (ref 3.5–5.0)
Alkaline Phosphatase: 59 U/L (ref 38–126)
Anion gap: 7 (ref 5–15)
BUN: 20 mg/dL (ref 8–23)
CO2: 24 mmol/L (ref 22–32)
Calcium: 8.5 mg/dL — ABNORMAL LOW (ref 8.9–10.3)
Chloride: 105 mmol/L (ref 98–111)
Creatinine, Ser: 0.96 mg/dL (ref 0.44–1.00)
GFR, Estimated: >60 mL/min (ref >60)
Glucose, Bld: 93 mg/dL (ref 70–99)
Potassium: 3.6 mmol/L (ref 3.5–5.1)
Sodium: 136 mmol/L (ref 135–145)
Total Bilirubin: 0.5 mg/dL (ref 0.3–1.2)
Total Protein: 6.7 g/dL (ref 6.5–8.1)

## 2023-02-20 LAB — CBC WITH DIFFERENTIAL/PLATELET
Abs Immature Granulocytes: 0.02 10*3/uL (ref 0.00–0.07)
Basophils Absolute: 0.1 10*3/uL (ref 0.0–0.1)
Basophils Relative: 1 %
Eosinophils Absolute: 0.1 10*3/uL (ref 0.0–0.5)
Eosinophils Relative: 2 %
HCT: 39.7 % (ref 36.0–46.0)
Hemoglobin: 13.2 g/dL (ref 12.0–15.0)
Immature Granulocytes: 0 %
Lymphocytes Relative: 31 %
Lymphs Abs: 2 10*3/uL (ref 0.7–4.0)
MCH: 30.9 pg (ref 26.0–34.0)
MCHC: 33.2 g/dL (ref 30.0–36.0)
MCV: 93 fL (ref 80.0–100.0)
Monocytes Absolute: 0.7 10*3/uL (ref 0.1–1.0)
Monocytes Relative: 11 %
Neutro Abs: 3.4 10*3/uL (ref 1.7–7.7)
Neutrophils Relative %: 55 %
Platelets: 248 10*3/uL (ref 150–400)
RBC: 4.27 MIL/uL (ref 3.87–5.11)
RDW: 13.2 % (ref 11.5–15.5)
WBC: 6.3 10*3/uL (ref 4.0–10.5)
nRBC: 0 % (ref 0.0–0.2)

## 2023-02-20 LAB — VITAMIN D 25 HYDROXY (VIT D DEFICIENCY, FRACTURES): Vit D, 25-Hydroxy: 75.87 ng/mL (ref 30–100)

## 2023-02-27 ENCOUNTER — Inpatient Hospital Stay (HOSPITAL_BASED_OUTPATIENT_CLINIC_OR_DEPARTMENT_OTHER): Payer: Medicare Other | Admitting: Physician Assistant

## 2023-02-27 VITALS — BP 134/87 | HR 81 | Temp 97.7°F | Resp 16 | Wt 166.0 lb

## 2023-02-27 DIAGNOSIS — Z1231 Encounter for screening mammogram for malignant neoplasm of breast: Secondary | ICD-10-CM

## 2023-02-27 DIAGNOSIS — Z170 Estrogen receptor positive status [ER+]: Secondary | ICD-10-CM | POA: Diagnosis not present

## 2023-02-27 DIAGNOSIS — C50412 Malignant neoplasm of upper-outer quadrant of left female breast: Secondary | ICD-10-CM | POA: Diagnosis not present

## 2023-02-27 NOTE — Patient Instructions (Addendum)
[facility] at [facility] **VISIT SUMMARY & IMPORTANT INSTRUCTIONS **   You were seen today by Rojelio Brenner PA-C for your history of breast cancer.    Your lab test (Breast Cancer Index) showed that you would benefit from an additional 5 years of antiestrogen treatment. WITHOUT additional antiestrogen treatment you have a 6.3% risk of recurrent breast cancer. WITH additional antiestrogen treatment, you have a 2.1 to 2.6% risk of recurrent breast cancer. After discussion of risks and benefits, you have decided not to take additional treatment at this time.  We will still continue to see you on an annual basis to screen you for any recurrent breat cancer.  LABS: Return in September 2025 for repeat labs.  OTHER TESTS: You will be due for your annual mammogram in September 2025.  FOLLOW-UP APPOINTMENT: Annual office visit and breast exam in September 2025 (after labs and mammogram).  Please let us know if you need an appointment sooner rather than later based on any other symptoms or clinical concerns.  ** Thank you for trusting me with your healthcare!  I strive to provide all of my patients with quality care at each visit.  If you receive a survey for this visit, I would be so grateful to you for taking the time to provide feedback.  Thank you in advance!  ~ Khalil Szczepanik                   Dr. Doreatha Massed   &   Rojelio Brenner, PA-C   - - - - - - - - - - - - - - - - - -    Thank you for choosing [facility] at [facility] to provide your oncology and hematology care.  To afford each patient quality time with our provider, please arrive at least 15 minutes before your scheduled appointment time.   If you have a lab appointment with the Cancer Center please come in thru the Main Entrance and check in at the main information desk.  You need to re-schedule your appointment should you arrive 10 or more minutes late.  We strive to give you  quality time with our providers, and arriving late affects you and other patients whose appointments are after yours.  Also, if you no show three or more times for appointments you may be dismissed from the clinic at the providers discretion.     Again, thank you for choosing [facility].  Our hope is that these requests will decrease the amount of time that you wait before being seen by our physicians.       _____________________________________________________________  Should you have questions after your visit to [facility], please contact our office at [phone] and follow the prompts.  Our office hours are 8:00 a.m. and 4:30 p.m. Monday - Friday.  Please note that voicemails left after 4:00 p.m. may not be returned until the following business day.  We are closed weekends and major holidays.  You do have access to a nurse 24-7, just call the main number to the clinic [phone] and do not press any options, hold on the line and a nurse will answer the phone.    For prescription refill requests, have your pharmacy contact our office and allow 72 hours.

## 2023-02-27 NOTE — Progress Notes (Addendum)
[facility] [address] [place] [address]   CLINIC:  Medical Oncology/Hematology  PCP:  Audie Pinto, FNP [address] [place] [address] [phone]   REASON FOR VISIT:  Follow-up for stage I left breast cancer   PRIOR THERAPY: - Lumpectomy/lymph node biopsy (01/25/2017) - Partial breast radiation (completed October 2018) - Tamoxifen (October 2018 through August 2023)   NGS Results: Myriad myrisk-heterozygosity for MUTYH with slightly increased risk for colon cancer   CURRENT THERAPY: - Anastrozole (since August 2023)  BRIEF ONCOLOGIC HISTORY:   Oncology History  Breast cancer of upper-outer quadrant of left female breast (HCC)  08/03/2018 Initial Diagnosis   Breast cancer of upper-outer quadrant of left female breast (HCC)   08/03/2018 Cancer Staging   Staging form: Breast, AJCC 8th Edition - Clinical stage from 08/03/2018: Stage IA (cT1a, cN0, cM0, G1, ER+, PR+, HER2-) - Signed by Doreatha Massed, MD on 08/03/2018     CANCER STAGING:  Cancer Staging  Breast cancer of upper-outer quadrant of left female breast [facility]) Staging form: Breast, AJCC 8th Edition - Clinical stage from 08/03/2018: Stage IA (cT1a, cN0, cM0, G1, ER+, PR+, HER2-) - Signed by Doreatha Massed, MD on 08/03/2018   INTERVAL HISTORY:   [name], a 62 y.o. female, returns for routine follow-up of her history of left-sided breast cancer. Monnette was last seen on 05/17/2022 by Rojelio Brenner PA-C.   At today's visit, she  reports feeling fair.  She denies any recent hospitalizations, surgeries, or changes in her  baseline health status.  She has not noticed any new onset bone pain or breast lumps.  She does have occasional sharp chest pain lasting <10 seconds, that worsened after she stopped taking her omeprazole.  She denies any exertional chest pain, dyspnea, or abdominal pain.  She does have some pelvic and groin pain and has been seeing urologist  with plans for upcoming pelvic floor physical therapy.   She has chronic headaches, that have been somewhat worse lately due to stress from recent death of sister.  She denies any new-onset seizures or focal neurologic deficits.  No B symptoms such as fever, chills, night sweats, unintentional weight loss.  She never started her anastrazole.  She reports 30% energy and 100% appetite.  She is maintaining stable weight at this time.   ASSESSMENT & PLAN:  1.  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 - Partial breast radiation therapy dose of 34 Gray at 3.4GY per fraction given twice daily 6 hours apart in 10 fractions completed on 03/24/2017 at [facility]. - Myriad myrisk-heterozygosity for MUTYH with slightly increased risk for colon cancer. (Colonoscopy in 2019 reportedly negative.) - Tamoxifen from October 2018 through August 2023.  She self discontinued her tamoxifen in August 2023.  She was recommended to switch to anastrozole after she stopped the tamoxifen, since GYN labs confirmed postmenopausal status.  Patient reports that she never started her anastrozole because she "does not like taking medication."   - BCI testing (03/02/2022) showed benefit from extended endocrine therapy (6.3% risk of late distant recurrence with 5 years of adjuvant endocrine therapy, versus 2.1 to 2.6% risk of late distant recurrence after 10 years of adjuvant endocrine therapy) - Labs obtained by GYN  on 03/09/2022 confirmed postmenopausal status. - Most recent mammogram (02/20/2023): No mammographic evidence of malignancy in either breast. - Labs (02/20/2023) showed normal CBC, normal CMP, normal LDH, normal vitamin D. - Physical exam on 02/27/2023  showed mild lymphedema of left lower  breast tissue.  No discrete nodules, masses, or lymphadenopathy of either breast. - ROS negative for any symptoms of recurrent breast cancer at this time. - PLAN: Extensive discussion with patient regarding risks and benefits of taking anastrozole x 5 years to complete recommended 10 years of hormonal therapy for breast cancer.  Patient verbalizes understanding of risks of noncompliance with this, but is adamant that she does not want to take any further hormonal breast cancer treatment such as tamoxifen, anastrozole, or similar. - RTC in September 2025 with repeat labs, screening mammogram, and office visit    2.  Bone health - DEXA scan completed 12/2018 showed normal bone density. - Most recent DEXA scan (02/20/2023): T-score 0.2, normal - Labs showed normal vitamin D 75.87, although she does not take any vitamin D supplement at home.  Mild hypocalcemia with calcium 8.5. - PLAN: Recommend increasing calcium intake - Continue weightbearing exercises as tolerated - Repeat DEXA/bone density scan in 2 years due to active treatment with aromatase inhibitor.     3.  Pelvic pain and postmenstrual bleeding - Reported pelvic pain and vaginal bleeding in summer/fall 2023 - Work-up by gynecologist including pelvic ultrasound revealed 2 subserosal uterine fibroids, asymmetrically thickened endometrium, and 3D imaging suggestive of a 1.3 cm intracavity mass with feeder vessel. - Endometrial biopsy was negative for malignancy. -Hysteroscopy with D&C/polypectomy by Dr. Oscar La on 04/04/2022 revealed large endometrial polyp (benign endometrial polyp negative for hyperplasia or malignancy). - PLAN: Continue GYN follow-up  PLAN SUMMARY: >> Mammogram in September 2025 >> Labs in September 2025 = CBC/D, CMP >> OFFICE visit in September 2025    REVIEW OF SYSTEMS:   Review of Systems  Constitutional:  Positive for fatigue. Negative for appetite change, chills, diaphoresis, fever and unexpected weight change.   HENT:   Negative for lump/mass and nosebleeds.   Eyes:  Negative for eye problems.  Respiratory:  Negative for cough, hemoptysis and shortness of breath.   Cardiovascular:  Positive for chest pain (Sharp, brief, intermittent). Negative for leg swelling and palpitations.  Gastrointestinal:  Positive for nausea. Negative for abdominal pain, blood in stool, constipation, diarrhea and vomiting.  Genitourinary:  Negative for hematuria.   Musculoskeletal:  Positive for back pain and flank pain.  Skin: Negative.   Neurological:  Positive for dizziness, headaches and numbness. Negative for light-headedness.  Hematological:  Does not bruise/bleed easily.  Psychiatric/Behavioral:  Positive for sleep disturbance.     PHYSICAL EXAM:   Performance status (ECOG): 1 - Symptomatic but completely ambulatory  Vitals:   02/27/23 1427  BP: 134/87  Pulse: 81  Resp: 16  Temp: 97.7 F (36.5 C)  SpO2: 100%   Wt Readings from Last 3 Encounters:  02/27/23 166 lb (75.3 kg)  11/29/22 165 lb 3.2 oz (74.9 kg)  05/17/22 164 lb (74.4 kg)   Physical Exam Constitutional:      Appearance: Normal appearance.  HENT:     Head: Normocephalic and atraumatic.     Mouth/Throat:     Mouth: Mucous membranes are moist.  Eyes:     Extraocular Movements: Extraocular movements intact.     Pupils: Pupils are equal, round, and reactive to light.  Cardiovascular:     Rate and Rhythm: Normal rate and  regular rhythm.     Pulses: Normal pulses.     Heart sounds: Normal heart sounds.  Pulmonary:     Effort: Pulmonary effort is normal.     Breath sounds: Normal breath sounds.  Chest:     Comments: Mild lymphedema of left lower breast tissue.  No discrete nodules, masses, or lymphadenopathy of either breast. Abdominal:     General: Bowel sounds are normal.     Palpations: Abdomen is soft.     Tenderness: There is no abdominal tenderness.  Musculoskeletal:        General: No swelling.     Right lower leg: No edema.      Left lower leg: No edema.  Lymphadenopathy:     Cervical: No cervical adenopathy.  Skin:    General: Skin is warm and dry.  Neurological:     General: No focal deficit present.     Mental Status: She is alert and oriented to person, place, and time.  Psychiatric:        Mood and Affect: Mood normal.        Behavior: Behavior normal.      PAST MEDICAL/SURGICAL HISTORY:  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/20/2017   left; radiation only   Cancer (HCC)    Diverticulitis    GERD (gastroesophageal reflux disease)    septic ulcers   H/O benign breast biopsy 1984   right   Hemorrhoids    Past Surgical History:  Procedure Laterality Date   BIOPSY  10/16/2020   Procedure: BIOPSY;  Surgeon: Lanelle Bal, DO;  Location: [facility];  Service: Endoscopy;;   BREAST LUMPECTOMY     CHOLECYSTECTOMY     COLONOSCOPY WITH PROPOFOL N/A 10/16/2020   Procedure: COLONOSCOPY WITH PROPOFOL;  Surgeon: Lanelle Bal, DO;  Location: [facility];  Service: Endoscopy;  Laterality: N/A;  PM   CYST EXCISION     DILATATION & CURETTAGE/HYSTEROSCOPY WITH MYOSURE N/A 04/04/2022   Procedure: DILATATION & CURETTAGE/HYSTEROSCOPY WITH MYOSURE;  Surgeon: Romualdo Bolk, MD;  Location: [facility] [place];  Service: Gynecology;  Laterality: N/A;   ESOPHAGOGASTRODUODENOSCOPY (EGD) WITH PROPOFOL N/A 10/16/2020   Procedure: ESOPHAGOGASTRODUODENOSCOPY (EGD) WITH PROPOFOL;  Surgeon: Lanelle Bal, DO;  Location: [facility];  Service: Endoscopy;  Laterality: N/A;   HERNIA REPAIR      SOCIAL HISTORY:  Social History   Socioeconomic History   Marital status: Single    Spouse name: Not on file   Number of children: Not on file   Years of education: Not on file   Highest education level: Not on file  Occupational History   Not on file  Tobacco Use   Smoking status: Former    Passive exposure: Past   Smokeless tobacco: Never  Vaping Use    Vaping status: Never Used  Substance and Sexual Activity   Alcohol use: Yes    Comment: 1-2 times per year   Drug use: Never   Sexual activity: Not Currently    Partners: Male    Birth control/protection: Post-menopausal  Other Topics Concern   Not on file  Social History Narrative   Not on file   Social Determinants of Health   Financial Resource Strain: Low Risk  (02/02/2021)   Overall Financial Resource Strain (CARDIA)    Difficulty of Paying Living Expenses: Not hard at all  Food Insecurity: No Food Insecurity (02/02/2021)   Hunger Vital  Sign    Worried About Programme researcher, broadcasting/film/video in the Last Year: Never true    Ran Out of Food in the Last Year: Never true  Transportation Needs: No Transportation Needs (02/02/2021)   PRAPARE - Administrator, Civil Service (Medical): No    Lack of Transportation (Non-Medical): No  Physical Activity: Insufficiently Active (02/02/2021)   Exercise Vital Sign    Days of Exercise per Week: 5 days    Minutes of Exercise per Session: 20 min  Stress: No Stress Concern Present (02/02/2021)   [employer] of Occupational Health - Occupational Stress Questionnaire    Feeling of Stress : Not at all  Social Connections: Socially Isolated (02/02/2021)   Social Connection and Isolation Panel [NHANES]    Frequency of Communication with Friends and Family: More than three times a week    Frequency of Social Gatherings with Friends and Family: More than three times a week    Attends Religious Services: Never    Database administrator or Organizations: No    Attends Banker Meetings: Never    Marital Status: Divorced  Catering manager Violence: Not At Risk (02/02/2021)   Humiliation, Afraid, Rape, and Kick questionnaire    Fear of Current or Ex-Partner: No    Emotionally Abused: No    Physically Abused: No    Sexually Abused: No    FAMILY HISTORY:  Family History  Problem Relation Age of Onset   Hypertension Mother    Cancer  Mother    Cancer Father    Cancer Sister    Diabetes Brother    Cancer Brother    Cancer Maternal Aunt        Ovarian   Breast cancer Maternal Aunt     CURRENT MEDICATIONS:  Current Outpatient Medications  Medication Sig Dispense Refill   omeprazole (PRILOSEC) 20 MG capsule Take 1 capsule (20 mg total) by mouth 2 (two) times daily before a meal. 180 capsule 3   No current facility-administered medications for this visit.    ALLERGIES:  Allergies  Allergen Reactions   Banana Anaphylaxis    Oral bumps   Dust Mite Extract Other (See Comments)    Per allergy test   Fluoride Preparations Itching    Caused throat to feel itchy   Other     Tree nuts--throat swelling/develops sores   Shellfish Allergy Swelling   Latex Rash and Other (See Comments)    Oral bumps/lesions    LABORATORY DATA:  I have reviewed the labs as listed.     Latest Ref Rng & Units 02/20/2023    2:04 PM 02/15/2022   10:27 AM 01/26/2021   10:27 AM  CBC  WBC 4.0 - 10.5 K/uL 6.3  4.7  5.8   Hemoglobin 12.0 - 15.0 g/dL 16.1  09.6  04.5   Hematocrit 36.0 - 46.0 % 39.7  38.8  38.3   Platelets 150 - 400 K/uL 248  220  239       Latest Ref Rng & Units 02/20/2023    2:04 PM 02/15/2022   10:27 AM 01/26/2021   10:27 AM  CMP  Glucose 70 - 99 mg/dL 93  91  409   BUN 8 - 23 mg/dL 20  18  14    Creatinine 0.44 - 1.00 mg/dL 8.11  9.14  7.82   Sodium 135 - 145 mmol/L 136  141  137   Potassium 3.5 - 5.1 mmol/L 3.6  4.0  3.8  Chloride 98 - 111 mmol/L 105  110  105   CO2 22 - 32 mmol/L 24  25  26    Calcium 8.9 - 10.3 mg/dL 8.5  8.9  8.9   Total Protein 6.5 - 8.1 g/dL 6.7  7.0  6.7   Total Bilirubin 0.3 - 1.2 mg/dL 0.5  0.6  0.3   Alkaline Phos 38 - 126 U/L 59  55  47   AST 15 - 41 U/L 24  22  23    ALT 0 - 44 U/L 29  27  26      DIAGNOSTIC IMAGING:  I have independently reviewed the scans and discussed with the patient. MM 3D SCREENING MAMMOGRAM BILATERAL BREAST  Result Date: 02/21/2023 CLINICAL DATA:  Screening.  EXAM: DIGITAL SCREENING BILATERAL MAMMOGRAM WITH TOMOSYNTHESIS AND CAD TECHNIQUE: Bilateral screening digital craniocaudal and mediolateral oblique mammograms were obtained. Bilateral screening digital breast tomosynthesis was performed. The images were evaluated with computer-aided detection. COMPARISON:  Previous exam(s). ACR Breast Density Category b: There are scattered areas of fibroglandular density. FINDINGS: There are no findings suspicious for malignancy. IMPRESSION: No mammographic evidence of malignancy. A result letter of this screening mammogram will be mailed directly to the patient. RECOMMENDATION: Screening mammogram in one year. (Code:SM-B-01Y) BI-RADS CATEGORY  1: Negative. Electronically Signed   By: Harmon Pier M.D.   On: 02/21/2023 15:04   DG Bone Density  Result Date: 02/20/2023 EXAM: DUAL X-RAY ABSORPTIOMETRY (DXA) FOR BONE MINERAL DENSITY IMPRESSION: Your patient Najah Moala completed a BMD test on 02/20/2023 using the [employer] DXA System (software version: 14.10) manufactured by [employer]. The following summarizes the results of our evaluation. Technologist: [facility] PATIENT BIOGRAPHICAL: Name: Shaquoya, Romans Patient ID: [id number] Birth Date: 02-24-1961 Height: 65.0 in. Gender: Female Exam Date: 02/20/2023 Weight: 165.2 lbs. Indications: Caucasian, Height Loss, History of Fracture (Adult), Hx Breast Ca, Low Calcium Intake, Post Menopausal Fractures: Ankle Treatments: Tamoxifen, Vitamin D, Calcium, Multivitamin DENSITOMETRY RESULTS: Site         Region     Measured Date Measured Age WHO Classification Young Adult T-score BMD         %Change vs. Previous Significant Change (*) DualFemur Neck Left 02/20/2023 62.3 Normal 0.6 1.115 g/cm2 -4.0% Yes DualFemur Neck Left 01/08/2019 58.2 Normal 0.9 1.162 g/cm2 - - DualFemur Total Mean 02/20/2023 62.3 Normal 1.4 1.186 g/cm2 -1.9% Yes DualFemur Total Mean 01/08/2019 58.2 Normal 1.6 1.209 g/cm2 - - Left Forearm Radius 33% 02/20/2023  62.3 Normal 0.2 0.727 g/cm2 - - ASSESSMENT: The BMD measured at Forearm Radius 33% is 0.727 g/cm2 with a T-score of 0.2. This patient is considered normal according to World Health Organization [facility]) criteria. The scan quality is good. Compared with the prior study on 01/08/19 , the BMD of the total mean shows a statistically significant decrease. Lumbar spine was excluded due to advanced degenerative changes. World Science writer [facility]) criteria for post-menopausal, Caucasian Women: Normal:       T-score at or above -1 SD Osteopenia:   T-score between -1 and -2.5 SD Osteoporosis: T-score at or below -2.5 SD RECOMMENDATIONS: 1. All patients should optimize calcium and vitamin D intake. 2. Consider FDA-approved medical therapies in postmenopausal women and med aged 67 years and older, based on the following: a. A hip or vertebral (clinical or morphometric) fracture b. T-score< -2.5 at the femoral neck or spine after appropriate evaluation to exclude secondary causes c. Low bone mass (T-score between -1.0 and -2.5 at the femoral neck or  spine) and a 10-year probability of a hip fracture > 3% or a 10-year probability of a major osteoporosis-related fracture > 20% based on the [place]-adapted WHO algorithm d. Clinician judgment and/or patient preferences may indicate treatment for people with 10-year fracture probabilities above or below these levels FOLLOW-UP: Patients with diagnosis of osteoporsis or at high risk for fracture should have regular bone mineral density tests. For patients eligible for Medicare, routine testing is allowed once every 2 years. The testing frequency can be increased to one year for patients who have rapidly progressing disease, those who are receiving or discontinuing medical therapy to restore bone mass, or have additional risk factors. I have reviewed this report, and agree with the above findings. [facility] Electronically Signed   By: Romona Curls M.D.   On: 02/20/2023  12:37     WRAP UP:  All questions were answered. The patient knows to call the clinic with any problems, questions or concerns.  Medical decision making: Moderate  Time spent on visit: I spent 20 minutes counseling the patient face to face. The total time spent in the appointment was 30 minutes and more than 50% was on counseling.  Carnella Guadalajara, PA-C  02/27/2023 8:50 PM

## 2023-08-30 DIAGNOSIS — R079 Chest pain, unspecified: Secondary | ICD-10-CM

## 2023-09-26 ENCOUNTER — Telehealth: Payer: Self-pay | Admitting: *Deleted

## 2023-09-26 NOTE — Telephone Encounter (Signed)
 Patient called to advise that she has a tender knot underneath left breast near rib area.  Mammogram not until 02/26/24.  Per Sheril Dines, PAC, appointment made for evaluation.

## 2023-10-03 ENCOUNTER — Inpatient Hospital Stay: Admitting: Physician Assistant

## 2023-10-30 NOTE — Progress Notes (Addendum)
 Cardiology Office Note:    Date:  10/31/2023   ID:  Haley Santana, DOB February 13, 1961, MRN [id number]  PCP:  Haley Darice BROCKS, FNP  Cardiologist:  Dub Huntsman, DO     Referring MD: Haley Darice BROCKS, FNP   Chief Complaint: chest pain  History of Present Illness:    Haley Santana is a 63 y.o. female with a history of chest pain with negative Myoview in 08/2023, hyperlipidemia, GERD, diverticulitis, stage Ia breast cancer (treated with radiation and Tamoxifen ), and prior tobacco abuse who presents today for further evaluation of chest pain.  Patient was referred to Dr. Ronal Ross in 05/2021 for further evaluation of fatigue/ weakness. She also reported intermittent dizziness. However, she denied any anginal or CHF symptoms. Echo was ordered for further evaluation and showed LVEF of 60-65% with normal wall motion and diastolic function, normal RV function, and mild AI. She has not been seen in our office since then.  She was admitted at [facility] in 08/2023 for further evaluation of chest pain. Myoview showed a fixed defect due to breast attenuation but no ischemia. LVEF was 74%.  She was seen by Cardiology at [facility] in [place] on 09/15/2023 for follow-up at which time she continued to report intermittent chest pressure when walking as well as a discomfort over her left breast. Coronary CTA was ordered for further evaluation and she was advised to follow up with GI as well.   Patient presents today for follow-up.  Here alone.  She states about 1 week prior to her [facility] she was bending over when all of a sudden she did developed a weird pain all the way from her left lower abdomen to her left chest that felt like this whole area was being stretched out.  Since then, she has been having that she states everything is trying to get back to normal.  A couple of nights ago, she had an episode of dull pain behind her left breast by her left breast feeling warm  and then the sensation radiating down to her lower abdomen/groin with a stinging pain in her groin.  She also reports intermittent sharp left-sided chest pain.  She states the pain may be worse after meals.  It is not pleuritic in nature.  It is not related to her exertion.  She also describes random episodes of shortness of breath and states sometimes she feels waterlogged.  No clear exertional shortness of breath.  No orthopnea, PND, orthopnea.  No palpitations.  She reports intermittent lightheadedness/dizziness.  These episodes are sometimes related to standing, but she also describes a sensation of the room spinning when she lays down which sounds like vertigo.  No syncope.  She also reports some pain in her groin and anterior thighs with walking which has been going on for a couple years. been going on for 1 year.  She cancelled the coronary CTA that [facility] ordered because she states she is not happy with them. However, she did have a chest/ abdominal/ pelvic CT completed at [facility] following discharge. I am unable to see the results of this but she states it was normal.  EKGs/Labs/Other Studies Reviewed:    The following studies were reviewed:  Echocardiogram 06/09/2021: Impressions: 1. Left ventricular ejection fraction, by estimation, is 60 to 65%. Left  ventricular ejection fraction by 3D volume is 62 %. The left ventricle has  normal function. The left ventricle has no regional wall motion  abnormalities. Left ventricular diastolic   parameters were normal.   2. Right ventricular systolic function is normal. The right ventricular  size is normal. There is normal pulmonary artery systolic pressure.   3. The mitral valve is normal in structure. Trivial mitral valve  regurgitation. No evidence of mitral stenosis.   4. The aortic valve is normal in structure. Aortic valve regurgitation is  mild. Aortic valve sclerosis is present, with no evidence of aortic valve   stenosis.   5. The inferior vena cava is normal in size with greater than 50%  respiratory variability, suggesting right atrial pressure of 3 mmHg.  _______________  Pablo 08/30/2023 [facility]): Summary: No ischemia seen on the scan.  Normal gated images. Normal EF 74%.   EKG:  EKG ordered today.   EKG Interpretation Date/Time:  Tuesday Oct 31 2023 11:41:15 EDT Ventricular Rate:  66 PR Interval:  162 QRS Duration:  80 QT Interval:  410 QTC Calculation: 429 R Axis:   123  Text Interpretation: Normal sinus rhythm Right axis deviation  Mild T wave inversions in lead aVL Confirmed by Jadine Patient [phone]) on 10/31/2023 11:55:33 AM    Recent Labs: 02/20/2023: ALT 29; BUN 20; Creatinine, Ser 0.96; Hemoglobin 13.2; Platelets 248; Potassium 3.6; Sodium 136  Recent Lipid Panel No results found for: CHOL, TRIG, HDL, CHOLHDL, VLDL, LDLCALC, LDLDIRECT  Physical Exam:    Vital Signs: BP (!) 154/92 (BP Location: Right Arm, Patient Position: Standing, Cuff Size: Normal)   Pulse 73   Ht 5' 5 (1.651 m)   Wt 169 lb 6.4 oz (76.8 kg)   SpO2 97%   BMI 28.19 kg/m     Wt Readings from Last 3 Encounters:  10/31/23 169 lb 6.4 oz (76.8 kg)  02/27/23 166 lb (75.3 kg)  11/29/22 165 lb 3.2 oz (74.9 kg)     General: 63 y.o. Caucasian female in no acute distress. HEENT: Normocephalic and atraumatic. Sclera clear.  Neck: Supple. No carotid bruits. No JVD. Heart: RRR. Distinct S1 and S2. No murmurs, gallops, or rubs.  Lungs: No increased work of breathing. Clear to ausculation bilaterally.  Abdomen: Soft, non-distended, and non-tender to palpation.  Extremities: No lower extremity edema.  Femoral, posterior tibial, and distal pedal pulses 2+ and equal bilaterally.  Skin: Warm and dry. Neuro: No focal deficits. Psych: Normal affect. Responds appropriately.   Assessment:    1. Chest pain of uncertain etiology   2. Dizziness   3. Hyperlipidemia, unspecified  hyperlipidemia type   4. Pain in both lower extremities   5. History of tobacco use     Plan:    Chest Pain Patient has a history of intermittent chest pain. She was admitted at [facility] in 08/2023 for this and Myoview showed no evidence of ischemia. She was recently seen by Cardiology at [facility] in 09/20[facility] and coronary CTA was ordered. However, she cancelled this. - She continues to have rather atypical chest pain. Pain may be associated with meals. No exertional pain. - EKG shows normal sinus rhythm with mild T wave inversions in lead aVL. She has had some mild T wave abnormalities in this lead before. - Symptoms sound very atypical. Offered to reorder coronary CTA but patient declined due to all the contrast dye she has had recently. She was agreeable to an Echo though so will order this. - Symptoms may be GI in nature. She is scheduled to see GI next month. - Will try to request records from [facility] so  we can review recent hospitalization.   Dizziness Patient reports intermittent dizziness with standing but also describes room spinning when laying down. - Orthostatic negative in the office. - Do not think this is cardiac in nature. No palpitations. Recommended following up with PCP for possible vertigo.  Hyperlipidemia Lipid panel in 07/2023: Total Cholesterol 228, Triglycerides 64, HDL 55, LDL 157.  - She is not currently on any statins. She states her PCP has talked about starting one multiple times but has not because she has a lot going on. Can discuss starting a statin at next visit.  Leg Pain Patient reports pain in bilateral groins and anterior thighs when walking which has been going on for a couple of years.  - She has good femoral, posterior tibial, and distal pedal pulses bilaterally. However, will check ABIs.  History of Tobacco Abuse Quit in 2022.  Disposition: Follow up in 3 months. Patient was previously followed by Dr. Ronal Ross. Will transition  her care to Dr. Sheena now that Dr. Ross is no longer with our practice.   Signed, Aline FORBES Door, PA-C  10/31/2023 1:08 PM    [facility]  ADDENDUM 12/20/2023: Echo showed LVEF of 55-60% with normal wall motion and grade 1 diastolic dysfunction, normal RV function, ad no significant valvular disease. Formal read of ABIs are still pending but preliminary read normal bilaterally. Notified patient of results via MyChart.  Her BP was mildly elevated at her visit. She does not have a history of hypertension listed in the [facility] system, but it is listed in Care Everywhere. However, she is not on any antihypertensives. Asked patient to keep a log of her BP/ HR for 1 week and then send this to me via MyChart.  Haley Dembeck E Yazid Pop, PA-C 12/20/2023 7:38 AM

## 2023-10-31 ENCOUNTER — Ambulatory Visit: Attending: Student | Admitting: Student

## 2023-10-31 ENCOUNTER — Encounter: Payer: Self-pay | Admitting: Student

## 2023-10-31 VITALS — BP 154/92 | HR 73 | Ht 65.0 in | Wt 169.4 lb

## 2023-10-31 DIAGNOSIS — M79604 Pain in right leg: Secondary | ICD-10-CM | POA: Diagnosis not present

## 2023-10-31 DIAGNOSIS — M79605 Pain in left leg: Secondary | ICD-10-CM

## 2023-10-31 DIAGNOSIS — E785 Hyperlipidemia, unspecified: Secondary | ICD-10-CM | POA: Diagnosis not present

## 2023-10-31 DIAGNOSIS — Z87891 Personal history of nicotine dependence: Secondary | ICD-10-CM

## 2023-10-31 DIAGNOSIS — R079 Chest pain, unspecified: Secondary | ICD-10-CM

## 2023-10-31 DIAGNOSIS — R42 Dizziness and giddiness: Secondary | ICD-10-CM | POA: Diagnosis not present

## 2023-10-31 NOTE — Patient Instructions (Addendum)
 Medication Instructions:  The current medical regimen is effective;  continue present plan and medications as directed. Please refer to the Current Medication list given to you today.  *If you need a refill on your cardiac medications before your next appointment, please call your pharmacy*  Lab Work:   NONE  Testing/Procedures: Your physician has requested that you have an echocardiogram. Echocardiography is a painless test that uses sound waves to create images of your heart. It provides your doctor with information about the size and shape of your heart and how well your heart's chambers and valves are working. This procedure takes approximately one hour. There are no restrictions for this procedure. Please do NOT wear cologne, perfume, aftershave, or lotions (deodorant is allowed). Please arrive 15 minutes prior to your appointment time.  Please note: We ask at that you not bring children with you during ultrasound (echo/ vascular) testing. Due to room size and safety concerns, children are not allowed in the ultrasound rooms during exams. Our front office staff cannot provide observation of children in our lobby area while testing is being conducted. An adult accompanying a patient to their appointment will only be allowed in the ultrasound room at the discretion of the ultrasound technician under special circumstances. We apologize for any inconvenience.   Your physician has requested that you have an ankle brachial index (ABI). During this test an ultrasound and blood pressure cuff are used to evaluate the arteries that supply the arms and legs with blood. Allow thirty minutes for this exam. There are no restrictions or special instructions.  Please note: We ask at that you not bring children with you during ultrasound (echo/ vascular) testing. Due to room size and safety concerns, children are not allowed in the ultrasound rooms during exams. Our front office staff cannot provide observation  of children in our lobby area while testing is being conducted. An adult accompanying a patient to their appointment will only be allowed in the ultrasound room at the discretion of the ultrasound technician under special circumstances. We apologize for any inconvenience.   Follow-Up: At [facility], you and your health needs are our priority.  As part of our continuing mission to provide you with exceptional heart care, our providers are all part of one team.  This team includes your primary Cardiologist (physician) and Advanced Practice Providers or APPs (Physician Assistants and Nurse Practitioners) who all work together to provide you with the care you need, when you need it.  Your next appointment:   3 month(s)  Provider:   Kardie Tobb, DO or Callie Goodrich, PA-C

## 2023-12-18 ENCOUNTER — Ambulatory Visit (HOSPITAL_COMMUNITY)
Admission: RE | Admit: 2023-12-18 | Discharge: 2023-12-18 | Disposition: A | Discharge status: Home / Self Care | Source: Ambulatory Visit | Attending: Student | Admitting: Student

## 2023-12-18 ENCOUNTER — Ambulatory Visit (HOSPITAL_BASED_OUTPATIENT_CLINIC_OR_DEPARTMENT_OTHER)
Admission: RE | Admit: 2023-12-18 | Discharge: 2023-12-18 | Disposition: A | Discharge status: Home / Self Care | Source: Ambulatory Visit | Attending: Student | Admitting: Student

## 2023-12-18 DIAGNOSIS — M79604 Pain in right leg: Secondary | ICD-10-CM | POA: Insufficient documentation

## 2023-12-18 DIAGNOSIS — M79605 Pain in left leg: Secondary | ICD-10-CM | POA: Insufficient documentation

## 2023-12-18 DIAGNOSIS — R079 Chest pain, unspecified: Secondary | ICD-10-CM | POA: Insufficient documentation

## 2023-12-18 LAB — ECHOCARDIOGRAM COMPLETE
AR max vel: 1.89 cm2
AV Area VTI: 1.88 cm2
AV Area mean vel: 1.77 cm2
AV Mean grad: 3 mmHg
AV Peak grad: 5.3 mmHg
AV Vena cont: 0.35 cm
Ao pk vel: 1.15 m/s
Area-P 1/2: 4.31 cm2
P 1/2 time: 495 ms
S' Lateral: 2.35 cm

## 2023-12-19 ENCOUNTER — Ambulatory Visit: Payer: Self-pay | Admitting: Student

## 2023-12-20 LAB — VAS US ABI WITH/WO TBI
Left ABI: 1.32
Right ABI: 1.27

## 2024-01-26 LAB — HM COLONOSCOPY

## 2024-02-21 ENCOUNTER — Telehealth: Payer: Self-pay | Admitting: *Deleted

## 2024-02-21 NOTE — Telephone Encounter (Addendum)
 Patient called to advise that she has recently developed moderate to severe pain in left breast following episode of chest pain and does not feel that she can tolerate mammogram at this time.  She requested an ultrasound rather than mammogram.  Discussed with [place], [facility] who has recommended that mammogram be postponed until after office visit on 9/22 and a determination will be made at that time as to course of action.

## 2024-02-23 ENCOUNTER — Other Ambulatory Visit (HOSPITAL_COMMUNITY): Payer: Self-pay

## 2024-02-23 ENCOUNTER — Encounter: Payer: Self-pay | Admitting: Cardiology

## 2024-02-23 ENCOUNTER — Ambulatory Visit: Attending: Cardiology | Admitting: Cardiology

## 2024-02-23 VITALS — BP 140/87 | HR 63 | Ht 65.0 in | Wt 171.2 lb

## 2024-02-23 DIAGNOSIS — Z01812 Encounter for preprocedural laboratory examination: Secondary | ICD-10-CM | POA: Diagnosis not present

## 2024-02-23 DIAGNOSIS — R079 Chest pain, unspecified: Secondary | ICD-10-CM

## 2024-02-23 DIAGNOSIS — E782 Mixed hyperlipidemia: Secondary | ICD-10-CM

## 2024-02-23 LAB — LIPID PANEL

## 2024-02-23 MED ORDER — METOPROLOL TARTRATE 50 MG PO TABS: 50.0000 mg | ORAL_TABLET | Freq: Two times a day (BID) | ORAL | 3 refills | Status: AC

## 2024-02-23 MED ORDER — BLOOD PRESSURE MONITOR AUTOMAT DEVI
1.0000 [IU] | Freq: Once | 0 refills | Status: AC
  Filled 2024-02-23: qty 1, 30d supply, fill #0

## 2024-02-23 NOTE — Patient Instructions (Addendum)
 Medication Instructions:  Your physician recommends that you continue on your current medications as directed. Please refer to the Current Medication list given to you today.  *If you need a refill on your cardiac medications before your next appointment, please call your pharmacy*  Lab Work: CMET, Mag, Lipids, Lp(a) If you have labs (blood work) drawn today and your tests are completely normal, you will receive your results only by: MyChart Message (if you have MyChart) OR A paper copy in the mail If you have any lab test that is abnormal or we need to change your treatment, we will call you to review the results.  Testing/Procedures:   Your cardiac CT will be scheduled at one of the below locations:   [facility] [address] [place], [place] [address] [phone] (Severe contrast allergies only)  OR   [facility] [address]  [place], [place] [address] [phone]  If scheduled at [facility], please arrive at the [facility] and Children's Entrance (Entrance C2) of [facility] 30 minutes prior to test start time. You can use the FREE valet parking offered at entrance C (encouraged to control the heart rate for the test)  Proceed to the [facility] Radiology Department (first floor) to check-in and test prep.  All radiology patients and guests should use entrance C2 at [facility], accessed from [facility], even though the hospital's physical address listed is [address].  If scheduled at the Heart and Vascular Tower at [address], please enter the parking lot using the [address] entrance and use the FREE valet service at the patient drop-off area. Enter the building and check-in with registration on the main floor.  Please follow these instructions carefully (unless otherwise directed):  An IV will be required for this test and Nitroglycerin will be given.  Hold all  erectile dysfunction medications at least 3 days (72 hrs) prior to test. (Ie viagra, cialis, sildenafil, tadalafil, etc)   On the Night Before the Test: Be sure to Drink plenty of water . Do not consume any caffeinated/decaffeinated beverages or chocolate 12 hours prior to your test. Do not take any antihistamines 12 hours prior to your test.  On the Day of the Test: Drink plenty of water  until 1 hour prior to the test. Do not eat any food 1 hour prior to test. You may take your regular medications prior to the test.  Take metoprolol  (Lopressor ) two hours prior to test. If you take Furosemide/Hydrochlorothiazide/Spironolactone/Chlorthalidone, please HOLD on the morning of the test. Patients who wear a continuous glucose monitor MUST remove the device prior to scanning. FEMALES- please wear underwire-free bra if available, avoid dresses & tight clothing      After the Test: Drink plenty of water . After receiving IV contrast, you may experience a mild flushed feeling. This is normal. On occasion, you may experience a mild rash up to 24 hours after the test. This is not dangerous. If this occurs, you can take Benadryl  25 mg, Zyrtec, Claritin, or Allegra and increase your fluid intake. (Patients taking Tikosyn should avoid Benadryl , and may take Zyrtec, Claritin, or Allegra) If you experience trouble breathing, this can be serious. If it is severe call 911 IMMEDIATELY. If it is mild, please call our office.  We will call to schedule your test 2-4 weeks out understanding that some insurance companies will need an authorization prior to the service being performed.   For  more information and frequently asked questions, please visit our website : [email or web address]  For non-scheduling related questions, please contact the cardiac imaging nurse navigator should you have any questions/concerns: Cardiac Imaging Nurse Navigators Direct Office Dial: [phone]   For scheduling  needs, including cancellations and rescheduling, please call [place], [phone].   Follow-Up: At [facility], you and your health needs are our priority.  As part of our continuing mission to provide you with exceptional heart care, our providers are all part of one team.  This team includes your primary Cardiologist (physician) and Advanced Practice Providers or APPs (Physician Assistants and Nurse Practitioners) who all work together to provide you with the care you need, when you need it.  Your next appointment:   16 week(s)  Provider:   Kardie Tobb, DO

## 2024-02-23 NOTE — Progress Notes (Unsigned)
 Cardiology Office Note:    Date:  02/25/2024   ID:  Haley Santana, DOB Jan 02, 1961, MRN [id number]  PCP:  Jackolyn Darice BROCKS, FNP  Cardiologist:  Dub Huntsman, DO  Electrophysiologist:  None   Referring MD: Jackolyn Darice BROCKS, FNP    I am having chest pain  History of Present Illness:    Haley Santana is a 63 y.o. female with a hx of breast cancer, GERD and arthiritis.   She was referred by her regular doctor for evaluation of chest pain.  She experiences sharp, forceful chest pain, described as a stretching sensation, occurring intermittently and lasting for extended periods. This pain prevents her from allowing her cat to sit on her chest. There are no palpitations during these episodes. She has a history of contrast allergies, which limits some diagnostic options.  In March, she underwent a nuclear stress test at [facility], which showed no blockages, and an echocardiogram, which was normal. Despite these results, she continues to experience chest pain. Her family history is significant for heart issues; her half-brother died of a heart attack at 23, and her biological father also had heart issues and died at 65.  She notes at times the pain is associated with shortness of breath.  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/20/2017   left; radiation only   Cancer (HCC)    Diverticulitis    GERD (gastroesophageal reflux disease)    septic ulcers   H/O benign breast biopsy 1984   right   Hemorrhoids     Past Surgical History:  Procedure Laterality Date   BIOPSY  10/16/2020   Procedure: BIOPSY;  Surgeon: Cindie Carlin POUR, DO;  [facility];  Service: Endoscopy;;   BREAST LUMPECTOMY     CHOLECYSTECTOMY     COLONOSCOPY WITH PROPOFOL  N/A 10/16/2020   Procedure: COLONOSCOPY WITH PROPOFOL ;  Surgeon: Cindie Carlin POUR, DO;  Location: [facility];  Service: Endoscopy;  Laterality: N/A;  PM   CYST EXCISION     DILATATION &  CURETTAGE/HYSTEROSCOPY WITH MYOSURE N/A 04/04/2022   Procedure: DILATATION & CURETTAGE/HYSTEROSCOPY WITH MYOSURE;  Surgeon: Jannis Kate Norris, MD;  Location: [facility];  Service: Gynecology;  Laterality: N/A;   ESOPHAGOGASTRODUODENOSCOPY (EGD) WITH PROPOFOL  N/A 10/16/2020   Procedure: ESOPHAGOGASTRODUODENOSCOPY (EGD) WITH PROPOFOL ;  Surgeon: Cindie Carlin POUR, DO;  [facility];  Service: Endoscopy;  Laterality: N/A;   HERNIA REPAIR      Current Medications: Current Meds  Medication Sig   Azelastine HCl 137 MCG/SPRAY SOLN Place 2 sprays into the nose as needed.   [EXPIRED] Blood Pressure Monitoring (BLOOD PRESSURE MONITOR AUTOMAT) DEVI Use as directed to measure blood pressure.   cetirizine (ZYRTEC) 10 MG tablet Take 10 mg by mouth daily.   metoprolol  tartrate (LOPRESSOR ) 50 MG tablet Take 1 tablet (50 mg total) by mouth 2 (two) times daily.   omeprazole  (PRILOSEC) 20 MG capsule Take 1 capsule (20 mg total) by mouth 2 (two) times daily before a meal.     Allergies:   Banana, Dust mite extract, Fluoride preparations, Other, Shellfish allergy, and Latex   Social History   Socioeconomic History   Marital status: Single    Spouse name: Not on file   Number of children: Not on file   Years of education: Not on file   Highest education level: Not on file  Occupational History   Not on file  Tobacco Use  Smoking status: Former    Passive exposure: Past   Smokeless tobacco: Never  Vaping Use   Vaping status: Never Used  Substance and Sexual Activity   Alcohol  use: Yes    Comment: 1-2 times per year   Drug use: Never   Sexual activity: Not Currently    Partners: Male    Birth control/protection: Post-menopausal  Other Topics Concern   Not on file  Social History Narrative   Not on file   Social Drivers of Health   Financial Resource Strain: Low Risk  (02/02/2021)   Overall Financial Resource Strain (CARDIA)    Difficulty of Paying Living  Expenses: Not hard at all  Food Insecurity: Unknown (11/20/2023)   Received from [facility]   Hunger Vital Sign    Within the past 12 months, you worried that your food would run out before you got money to buy more: Patient declined to answer    Within the past 12 months, the food you bought just didn't last and you didn't have money to get more. : Patient declined to answer  Transportation Needs: Not on file (11/20/2023)  Physical Activity: Insufficiently Active (02/02/2021)   Exercise Vital Sign    Days of Exercise per Week: 5 days    Minutes of Exercise per Session: 20 min  Stress: No Stress Concern Present (02/02/2021)   [employer] of Occupational Health - Occupational Stress Questionnaire    Feeling of Stress : Not at all  Social Connections: Socially Isolated (02/02/2021)   Social Connection and Isolation Panel    Frequency of Communication with Friends and Family: More than three times a week    Frequency of Social Gatherings with Friends and Family: More than three times a week    Attends Religious Services: Never    Database administrator or Organizations: No    Attends Banker Meetings: Never    Marital Status: Divorced     Family History: The patient's family history includes Breast cancer in her maternal aunt; Cancer in her brother, father, maternal aunt, mother, and sister; Diabetes in her brother; Hypertension in her mother.  ROS:   Review of Systems  Constitution: Negative for decreased appetite, fever and weight gain.  HENT: Negative for congestion, ear discharge, hoarse voice and sore throat.   Eyes: Negative for discharge, redness, vision loss in right eye and visual halos.  Cardiovascular: Negative for chest pain, dyspnea on exertion, leg swelling, orthopnea and palpitations.  Respiratory: Negative for cough, hemoptysis, shortness of breath and snoring.   Endocrine: Negative for heat intolerance and polyphagia.  Hematologic/Lymphatic: Negative  for bleeding problem. Does not bruise/bleed easily.  Skin: Negative for flushing, nail changes, rash and suspicious lesions.  Musculoskeletal: Negative for arthritis, joint pain, muscle cramps, myalgias, neck pain and stiffness.  Gastrointestinal: Negative for abdominal pain, bowel incontinence, diarrhea and excessive appetite.  Genitourinary: Negative for decreased libido, genital sores and incomplete emptying.  Neurological: Negative for brief paralysis, focal weakness, headaches and loss of balance.  Psychiatric/Behavioral: Negative for altered mental status, depression and suicidal ideas.  Allergic/Immunologic: Negative for HIV exposure and persistent infections.    EKGs/Labs/Other Studies Reviewed:    The following studies were reviewed today:   EKG:  The ekg ordered today demonstrates   Recent Labs: 02/23/2024: ALT 27; BUN 11; Creatinine, Ser 0.93; Magnesium 2.0; Potassium 4.3; Sodium 140  Recent Lipid Panel    Component Value Date/Time   CHOL 215 (H) 02/23/2024 0955   TRIG 94 02/23/2024  0955   HDL 46 02/23/2024 0955   CHOLHDL 4.7 (H) 02/23/2024 0955   LDLCALC 152 (H) 02/23/2024 0955    Physical Exam:    VS:  BP (!) 140/87   Pulse 63   Ht 5' 5 (1.651 m)   Wt 171 lb 3.2 oz (77.7 kg)   SpO2 98%   BMI 28.49 kg/m     Wt Readings from Last 3 Encounters:  02/23/24 171 lb 3.2 oz (77.7 kg)  10/31/23 169 lb 6.4 oz (76.8 kg)  02/27/23 166 lb (75.3 kg)     GEN: Well nourished, well developed in no acute distress HEENT: Normal NECK: No JVD; No carotid bruits LYMPHATICS: No lymphadenopathy CARDIAC: S1S2 noted,RRR, no murmurs, rubs, gallops RESPIRATORY:  Clear to auscultation without rales, wheezing or rhonchi  ABDOMEN: Soft, non-tender, non-distended, +bowel sounds, no guarding. EXTREMITIES: No edema, No cyanosis, no clubbing MUSCULOSKELETAL:  No deformity  SKIN: Warm and dry NEUROLOGIC:  Alert and oriented x 3, non-focal PSYCHIATRIC:  Normal affect, good  insight  ASSESSMENT:    1. Pre-procedure lab exam   2. Chest pain of uncertain etiology   3. Mixed hyperlipidemia    PLAN:    Chest pain, possible atypical angina Intermittent chest pain with atypical presentation, possibly related to atypical angina. Previous nuclear stress test showed no blockages. Symptoms may be related to gastrointestinal issues. Family history of heart disease noted. The symptoms chest pain is concerning, this patient does have intermediate risk for coronary artery disease and at this time I would like to pursue an ischemic evaluation in this patient.  Shared decision a coronary CTA at this time is appropriate.  I have discussed with the patient about the testing.  The patient has no IV contrast allergy and is agreeable to proceed with this test.     Hypertension Blood pressure elevated at 140/82, previously recorded at 130. - Prescribe blood pressure cuff for home monitoring. - Monitor blood pressure and address if it remains elevated.  Hyperlipidemia Cholesterol levels previously discussed. Plan to repeat lipid panel and assess for genetic factors. - Repeat lipid panel. Will also get Lpa  The patient is in agreement with the above plan. The patient left the office in stable condition.  The patient will follow up in   Medication Adjustments/Labs and Tests Ordered: Current medicines are reviewed at length with the patient today.  Concerns regarding medicines are outlined above.  Orders Placed This Encounter  Procedures   Comp Met (CMET)   Magnesium   Lipid panel   Lipoprotein A (LPA)   Meds ordered this encounter  Medications   metoprolol  tartrate (LOPRESSOR ) 50 MG tablet    Sig: Take 1 tablet (50 mg total) by mouth 2 (two) times daily.    Dispense:  180 tablet    Refill:  3   Blood Pressure Monitoring (BLOOD PRESSURE MONITOR AUTOMAT) DEVI    Sig: Use as directed to measure blood pressure.    Dispense:  1 each    Refill:  0    Patient  Instructions  Medication Instructions:  Your physician recommends that you continue on your current medications as directed. Please refer to the Current Medication list given to you today.  *If you need a refill on your cardiac medications before your next appointment, please call your pharmacy*  Lab Work: CMET, Mag, Lipids, Lp(a) If you have labs (blood work) drawn today and your tests are completely normal, you will receive your results only by: MyChart Message (if you  have MyChart) OR A paper copy in the mail If you have any lab test that is abnormal or we need to change your treatment, we will call you to review the results.  Testing/Procedures:   Your cardiac CT will be scheduled at one of the below locations:   [facility] [address] [place], [place] [address] [phone] (Severe contrast allergies only)  OR   [facility] [address]  [place], [place] [address] [phone]  If scheduled at [facility], please arrive at the [facility] and Children's Entrance (Entrance C2) of [facility] 30 minutes prior to test start time. You can use the FREE valet parking offered at entrance C (encouraged to control the heart rate for the test)  Proceed to the [facility] Radiology Department (first floor) to check-in and test prep.  All radiology patients and guests should use entrance C2 at [facility], accessed from [facility], even though the hospital's physical address listed is [address].  If scheduled at the Heart and Vascular Tower at [address], please enter the parking lot using the [address] entrance and use the FREE valet service at the patient drop-off area. Enter the building and check-in with registration on the main floor.  Please follow these instructions carefully (unless otherwise directed):  An IV will be required for this test and Nitroglycerin will be  given.  Hold all erectile dysfunction medications at least 3 days (72 hrs) prior to test. (Ie viagra, cialis, sildenafil, tadalafil, etc)   On the Night Before the Test: Be sure to Drink plenty of water . Do not consume any caffeinated/decaffeinated beverages or chocolate 12 hours prior to your test. Do not take any antihistamines 12 hours prior to your test.  On the Day of the Test: Drink plenty of water  until 1 hour prior to the test. Do not eat any food 1 hour prior to test. You may take your regular medications prior to the test.  Take metoprolol  (Lopressor ) two hours prior to test. If you take Furosemide/Hydrochlorothiazide/Spironolactone/Chlorthalidone, please HOLD on the morning of the test. Patients who wear a continuous glucose monitor MUST remove the device prior to scanning. FEMALES- please wear underwire-free bra if available, avoid dresses & tight clothing      After the Test: Drink plenty of water . After receiving IV contrast, you may experience a mild flushed feeling. This is normal. On occasion, you may experience a mild rash up to 24 hours after the test. This is not dangerous. If this occurs, you can take Benadryl  25 mg, Zyrtec, Claritin, or Allegra and increase your fluid intake. (Patients taking Tikosyn should avoid Benadryl , and may take Zyrtec, Claritin, or Allegra) If you experience trouble breathing, this can be serious. If it is severe call 911 IMMEDIATELY. If it is mild, please call our office.  We will call to schedule your test 2-4 weeks out understanding that some insurance companies will need an authorization prior to the service being performed.   For more information and frequently asked questions, please visit our website : [email or web address]  For non-scheduling related questions, please contact the cardiac imaging nurse navigator should you have any questions/concerns: Cardiac Imaging Nurse Navigators Direct Office Dial: [phone]    For scheduling needs, including cancellations and rescheduling, please call [place], [phone].   Follow-Up: At [facility], you and your health needs are our priority.  As part of our continuing mission to provide you  with exceptional heart care, our providers are all part of one team.  This team includes your primary Cardiologist (physician) and Advanced Practice Providers or APPs (Physician Assistants and Nurse Practitioners) who all work together to provide you with the care you need, when you need it.  Your next appointment:   16 week(s)  Provider:   Xiao Graul, DO              Adopting a Healthy Lifestyle.  Know what a healthy weight is for you (roughly BMI <25) and aim to maintain this   Aim for 7+ servings of fruits and vegetables daily   65-80+ fluid ounces of water  or unsweet tea for healthy kidneys   Limit to max 1 drink of alcohol  per day; avoid smoking/tobacco   Limit animal fats in diet for cholesterol and heart health - choose grass fed whenever available   Avoid highly processed foods, and foods high in saturated/trans fats   Aim for low stress - take time to unwind and care for your mental health   Aim for 150 min of moderate intensity exercise weekly for heart health, and weights twice weekly for bone health   Aim for 7-9 hours of sleep daily   When it comes to diets, agreement about the perfect plan isnt easy to find, even among the experts. Experts at the [facility] [employer] developed an idea known as the Healthy Eating Plate. Just imagine a plate divided into logical, healthy portions.   The emphasis is on diet quality:   Load up on vegetables and fruits - one-half of your plate: Aim for color and variety, and remember that potatoes dont count.   Go for whole grains - one-quarter of your plate: Whole wheat, barley, wheat berries, quinoa, oats, brown rice, and foods made with them. If you want pasta, go with whole wheat  pasta.   Protein power - one-quarter of your plate: Fish, chicken, beans, and nuts are all healthy, versatile protein sources. Limit red meat.   The diet, however, does go beyond the plate, offering a few other suggestions.   Use healthy plant oils, such as olive, canola, soy, corn, sunflower and peanut. Check the labels, and avoid partially hydrogenated oil, which have unhealthy trans fats.   If youre thirsty, drink water . Coffee and tea are good in moderation, but skip sugary drinks and limit milk and dairy products to one or two daily servings.   The type of carbohydrate in the diet is more important than the amount. Some sources of carbohydrates, such as vegetables, fruits, whole grains, and beans-are healthier than others.   Finally, stay active  Signed, Kristia Jupiter, DO  02/25/2024 1:56 PM    [facility]

## 2024-02-25 LAB — COMPREHENSIVE METABOLIC PANEL WITH GFR
ALT: 27 IU/L (ref 0–32)
AST: 27 IU/L (ref 0–40)
Albumin: 4.2 g/dL (ref 3.9–4.9)
Alkaline Phosphatase: 78 IU/L (ref 44–121)
BUN/Creatinine Ratio: 12 (ref 12–28)
BUN: 11 mg/dL (ref 8–27)
Bilirubin Total: 0.4 mg/dL (ref 0.0–1.2)
CO2: 24 mmol/L (ref 20–29)
Calcium: 9.6 mg/dL (ref 8.7–10.3)
Chloride: 101 mmol/L (ref 96–106)
Creatinine, Ser: 0.93 mg/dL (ref 0.57–1.00)
Globulin, Total: 2.6 g/dL (ref 1.5–4.5)
Glucose: 81 mg/dL (ref 70–99)
Potassium: 4.3 mmol/L (ref 3.5–5.2)
Sodium: 140 mmol/L (ref 134–144)
Total Protein: 6.8 g/dL (ref 6.0–8.5)
eGFR: 69 mL/min/1.73 (ref >59)

## 2024-02-25 LAB — LIPOPROTEIN A (LPA): Lipoprotein (a): 103.7 nmol/L — AB (ref <75.0)

## 2024-02-25 LAB — LIPID PANEL
Cholesterol, Total: 215 mg/dL — AB (ref 100–199)
HDL: 46 mg/dL (ref >39)
LDL CALC COMMENT:: 4.7 ratio — AB (ref 0.0–4.4)
LDL Chol Calc (NIH): 152 mg/dL — AB (ref 0–99)
Triglycerides: 94 mg/dL (ref 0–149)
VLDL Cholesterol Cal: 17 mg/dL (ref 5–40)

## 2024-02-25 LAB — MAGNESIUM: Magnesium: 2 mg/dL (ref 1.6–2.3)

## 2024-02-26 ENCOUNTER — Inpatient Hospital Stay: Payer: Medicare Other

## 2024-02-26 ENCOUNTER — Ambulatory Visit (HOSPITAL_COMMUNITY): Payer: Medicare Other

## 2024-03-01 ENCOUNTER — Other Ambulatory Visit: Payer: Self-pay

## 2024-03-01 DIAGNOSIS — Z170 Estrogen receptor positive status [ER+]: Secondary | ICD-10-CM

## 2024-03-02 NOTE — Progress Notes (Unsigned)
 [facility] [address], [place] [address]   CLINIC:  Medical Oncology/Hematology  PCP:  Jackolyn Darice BROCKS, FNP [address] [place] [address] [phone]   REASON FOR VISIT:  Follow-up for stage I left breast cancer   PRIOR THERAPY: - Lumpectomy/lymph node biopsy (01/25/2017) - Partial breast radiation (completed October 2018) - Tamoxifen  (October 2018 through August 2023)   NGS Results: Myriad myrisk-heterozygosity for MUTYH with slightly increased risk for colon cancer   CURRENT THERAPY: Surveillance  BRIEF ONCOLOGIC HISTORY:   Oncology History  Breast cancer of upper-outer quadrant of left female breast (HCC)  08/03/2018 Initial Diagnosis   Breast cancer of upper-outer quadrant of left female breast (HCC)   08/03/2018 Cancer Staging   Staging form: Breast, AJCC 8th Edition - Clinical stage from 08/03/2018: Stage IA (cT1a, cN0, cM0, G1, ER+, PR+, HER2-) - Signed by Rogers Hai, MD on 08/03/2018     CANCER STAGING:  Cancer Staging  Breast cancer of upper-outer quadrant of left female breast [facility]) Staging form: Breast, AJCC 8th Edition - Clinical stage from 08/03/2018: Stage IA (cT1a, cN0, cM0, G1, ER+, PR+, HER2-) - Signed by Rogers Hai, MD on 08/03/2018   INTERVAL HISTORY:   [name], a 63 y.o. female, returns for routine follow-up of her history of left-sided breast cancer. Delena was last seen on 02/27/2023 by Pleasant Barefoot PA-C.   At today's visit, she  reports feeling poorly due to fatigue and other symptoms as described below. She was originally scheduled for mammogram on 02/26/2024, but this was postponed due to patient report of severe breast tenderness.  She reports breast tenderness on and off for the past 6 months.  Initial episode occurred when she bent over felt a pain extending from her left groin into her left breast/chest wall/shoulder, described as her insides and stretching out inside of  her.  She states that everything jostled around inside of her, and she had onset of breast tenderness and swelling not long after that episode.  Breast swelling reportedly improved on its own, but with some left breast edema and pain over the past month.  She denies any new breast lumps or axillary lymphadenopathy. She does have occasional sharp left-sided chest wall pain lasting <10 seconds. She denies any new onset bone pain, exertional chest pain, dyspnea, or abdominal pain. She has chronic headaches. She denies any new-onset seizures or focal neurologic deficits. No B symptoms such as fever, chills, night sweats, unintentional weight loss. She completed 5 years of antiestrogen therapy, but declined to complete 10 years.  She reports little to no energy and 100% appetite.  She is maintaining stable weight at this time.  ASSESSMENT & PLAN:  1.  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 - Partial breast radiation therapy dose of 34 Gray at 3.4GY per fraction given twice daily 6 hours apart in 10 fractions completed on 03/24/2017 at [facility]. - Myriad myrisk-heterozygosity for MUTYH with slightly increased risk for colon cancer. (Colonoscopy in 2019 reportedly negative.) - Tamoxifen  from October 2018 through August 2023.  She self discontinued her tamoxifen  in August 2023.  She was recommended to switch to anastrozole  after she stopped the tamoxifen , since GYN labs confirmed postmenopausal status.  Patient declined  anastrozole  because she does not like taking medication.   - Patient opted to stop after 5 years of treatment with tamoxifen , although BCI testing (03/02/2022) showed benefit from extended endocrine therapy (6.3% risk of late distant recurrence  with 5 years of adjuvant endocrine therapy, versus 2.1 to 2.6% risk of late distant recurrence after 10 years of adjuvant endocrine therapy). Extensive discussion with patient regarding risks and benefits of taking anastrozole  x 5 years to complete recommended 10 years of hormonal therapy for breast cancer.  Patient verbalizes understanding of risks of noncompliance with this, but is adamant that she does not want to take any further hormonal breast cancer treatment such as tamoxifen , anastrozole , or similar. - Labs obtained by GYN on 03/09/2022 confirmed postmenopausal status.  - Most recent mammogram (02/20/2023): No mammographic evidence of malignancy in either breast. - Labs (03/04/2024) showed normal CBC overall baseline CMP (mildly elevated creatinine 1.10, advised to discuss with PCP) - Physical exam (03/04/2024) showed significant left breast lymphedema, particularly around the nipple and areola with peau de orange appearance of skin.  Limited palpation of left breast due to edema and tenderness, but there were no discrete nodules or masses.  Right breast was within normal limits.  No axillary lymphadenopathy bilaterally. - Intermittent left breast edema and tenderness since March 2025, as described in HPI. - PLAN: We will check bilateral diagnostic mammogram of left breast with left breast US  due to new left breast lymphedema. - RTC in September 2026 with repeat labs, screening mammogram, and office visit   2.  Bone health - DEXA scan completed 12/2018 showed normal bone density. - Most recent DEXA scan (02/20/2023): T-score 0.2, normal - Labs showed normal vitamin D  75.87, although she does not take any vitamin D  supplement at home.  Mild hypocalcemia with calcium 8.5. - PLAN: No further DEXA/bone density scans planned at this time, as patient has stopped her estrogen reducing breast cancer treatments.  Ongoing management deferred to PCP for age-appropriate screenings for osteoporosis.   3.   Pelvic pain and postmenstrual bleeding - Reported pelvic pain and vaginal bleeding in summer/fall 2023 - Work-up by gynecologist including pelvic ultrasound revealed 2 subserosal uterine fibroids, asymmetrically thickened endometrium, and 3D imaging suggestive of a 1.3 cm intracavity mass with feeder vessel. - Endometrial biopsy was negative for malignancy. -Hysteroscopy with D&C/polypectomy by Dr. Jannis on 04/04/2022 revealed large endometrial polyp (benign endometrial polyp negative for hyperplasia or malignancy). - PLAN: Continue GYN follow-up  PLAN SUMMARY:  >> Bilateral diagnostic mammogram + left breast US  >> Labs in October 2026 = CBC/D, CMP >> **Will send reminder to schedule for screening mammogram in October 2026, as long as the above tests are normal**  >> OFFICE visit in October 2026    REVIEW OF SYSTEMS:   Review of Systems  Constitutional:  Positive for fatigue. Negative for appetite change, chills, diaphoresis, fever and unexpected weight change.  HENT:   Negative for lump/mass and nosebleeds.   Eyes:  Negative for eye problems.  Respiratory:  Negative for cough, hemoptysis and shortness of breath.   Cardiovascular:  Positive for chest pain (left breast pain). Negative for leg swelling and palpitations.  Gastrointestinal:  Positive for nausea. Negative for abdominal pain, blood in stool, constipation, diarrhea and vomiting.  Genitourinary:  Positive for difficulty urinating. Negative for hematuria.   Musculoskeletal:  Positive for back pain. Negative for flank pain.  Skin: Negative.   Neurological:  Positive for dizziness and headaches. Negative for light-headedness and numbness.  Hematological:  Does not bruise/bleed easily.  Psychiatric/Behavioral:  Positive for sleep disturbance.     PHYSICAL EXAM:   Performance status (ECOG): 1 - Symptomatic but completely ambulatory  Vitals:   03/04/24 1321  BP: 130/65  Pulse: (!) 59  Resp: 18  Temp: 98.6 F (37 C)  SpO2:  98%    Wt Readings from Last 3 Encounters:  03/04/24 170 lb 6.7 oz (77.3 kg)  02/23/24 171 lb 3.2 oz (77.7 kg)  10/31/23 169 lb 6.4 oz (76.8 kg)   Physical Exam Constitutional:      Appearance: Normal appearance.  HENT:     Head: Normocephalic and atraumatic.     Mouth/Throat:     Mouth: Mucous membranes are moist.  Eyes:     Extraocular Movements: Extraocular movements intact.     Pupils: Pupils are equal, round, and reactive to light.  Cardiovascular:     Rate and Rhythm: Regular rhythm. Bradycardia present.     Pulses: Normal pulses.     Heart sounds: Normal heart sounds.  Pulmonary:     Effort: Pulmonary effort is normal.     Breath sounds: Normal breath sounds.  Chest:     Comments: Significant left breast lymphedema, particularly around the nipple and areola with peau de orange appearance of skin.  Limited palpation of left breast due to edema and tenderness, but there were no discrete nodules or masses.  Right breast was within normal limits.  No axillary lymphadenopathy bilaterally. Abdominal:     General: Bowel sounds are normal.     Palpations: Abdomen is soft.     Tenderness: There is no abdominal tenderness.  Musculoskeletal:        General: No swelling.     Right lower leg: No edema.     Left lower leg: No edema.  Lymphadenopathy:     Cervical: No cervical adenopathy.  Skin:    General: Skin is warm and dry.  Neurological:     General: No focal deficit present.     Mental Status: She is alert and oriented to person, place, and time.  Psychiatric:        Mood and Affect: Mood normal.        Behavior: Behavior normal.      PAST MEDICAL/SURGICAL HISTORY:  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/20/2017   left; radiation only   Cancer (HCC)    Diverticulitis    GERD (gastroesophageal reflux disease)    septic ulcers   H/O benign breast biopsy 1984   right   Hemorrhoids    Past Surgical History:  Procedure  Laterality Date   BIOPSY  10/16/2020   Procedure: BIOPSY;  Surgeon: Cindie Carlin POUR, DO;  Location: [facility];  Service: Endoscopy;;   BREAST LUMPECTOMY     CHOLECYSTECTOMY     COLONOSCOPY WITH PROPOFOL  N/A 10/16/2020   Procedure: COLONOSCOPY WITH PROPOFOL ;  Surgeon: Cindie Carlin POUR, DO;  Location: [facility];  Service: Endoscopy;  Laterality: N/A;  PM   CYST EXCISION     DILATATION & CURETTAGE/HYSTEROSCOPY WITH MYOSURE N/A 04/04/2022   Procedure: DILATATION & CURETTAGE/HYSTEROSCOPY WITH MYOSURE;  Surgeon: Jannis Kate Norris, MD;  Location: [facility];  Service: Gynecology;  Laterality: N/A;   ESOPHAGOGASTRODUODENOSCOPY (EGD) WITH PROPOFOL  N/A 10/16/2020   Procedure: ESOPHAGOGASTRODUODENOSCOPY (EGD) WITH PROPOFOL ;  Surgeon: Cindie Carlin POUR, DO;  Location: [facility];  Service: Endoscopy;  Laterality: N/A;   HERNIA REPAIR  SOCIAL HISTORY:  Social History   Socioeconomic History   Marital status: Single    Spouse name: Not on file   Number of children: Not on file   Years of education: Not on file   Highest education level: Not on file  Occupational History   Not on file  Tobacco Use   Smoking status: Former    Passive exposure: Past   Smokeless tobacco: Never  Vaping Use   Vaping status: Never Used  Substance and Sexual Activity   Alcohol  use: Yes    Comment: 1-2 times per year   Drug use: Never   Sexual activity: Not Currently    Partners: Male    Birth control/protection: Post-menopausal  Other Topics Concern   Not on file  Social History Narrative   Not on file   Social Drivers of Health   Financial Resource Strain: Low Risk  (02/02/2021)   Overall Financial Resource Strain (CARDIA)    Difficulty of Paying Living Expenses: Not hard at all  Food Insecurity: Unknown (11/20/2023)   Received from [facility]   Hunger Vital Sign    Within the past 12 months, you worried that your food would run out before you got money to buy more:  Patient declined to answer    Within the past 12 months, the food you bought just didn't last and you didn't have money to get more. : Patient declined to answer  Transportation Needs: Not on file (11/20/2023)  Physical Activity: Insufficiently Active (02/02/2021)   Exercise Vital Sign    Days of Exercise per Week: 5 days    Minutes of Exercise per Session: 20 min  Stress: No Stress Concern Present (02/02/2021)   [employer] of Occupational Health - Occupational Stress Questionnaire    Feeling of Stress : Not at all  Social Connections: Socially Isolated (02/02/2021)   Social Connection and Isolation Panel    Frequency of Communication with Friends and Family: More than three times a week    Frequency of Social Gatherings with Friends and Family: More than three times a week    Attends Religious Services: Never    Database administrator or Organizations: No    Attends Banker Meetings: Never    Marital Status: Divorced  Catering manager Violence: Not At Risk (02/02/2021)   Humiliation, Afraid, Rape, and Kick questionnaire    Fear of Current or Ex-Partner: No    Emotionally Abused: No    Physically Abused: No    Sexually Abused: No    FAMILY HISTORY:  Family History  Problem Relation Age of Onset   Hypertension Mother    Cancer Mother    Cancer Father    Cancer Sister    Diabetes Brother    Cancer Brother    Cancer Maternal Aunt        Ovarian   Breast cancer Maternal Aunt     CURRENT MEDICATIONS:  Current Outpatient Medications  Medication Sig Dispense Refill   cetirizine (ZYRTEC) 10 MG tablet Take 10 mg by mouth daily.     metoprolol  tartrate (LOPRESSOR ) 50 MG tablet Take 1 tablet (50 mg total) by mouth 2 (two) times daily. 180 tablet 3   omeprazole  (PRILOSEC) 20 MG capsule Take 1 capsule (20 mg total) by mouth 2 (two) times daily before a meal. 180 capsule 3   oxyCODONE -acetaminophen  (PERCOCET) 10-325 MG tablet Take 1 tablet by mouth 3 (three) times daily  as needed.     No current facility-administered  medications for this visit.    ALLERGIES:  Allergies  Allergen Reactions   Banana Anaphylaxis    Oral bumps   Dust Mite Extract Other (See Comments)    Per allergy test   Fluoride Preparations Itching    Caused throat to feel itchy   Justicia Adhatoda Other (See Comments)   Other     Tree nuts--throat swelling/develops sores   Shellfish Allergy Swelling   Latex Rash and Other (See Comments)    Oral bumps/lesions   Sulfa  Antibiotics Dermatitis    Pt reports has taken in past    LABORATORY DATA:  I have reviewed the labs as listed.     Latest Ref Rng & Units 03/04/2024   12:34 PM 02/20/2023    2:04 PM 02/15/2022   10:27 AM  CBC  WBC 4.0 - 10.5 K/uL 5.6  6.3  4.7   Hemoglobin 12.0 - 15.0 g/dL 86.5  86.7  86.9   Hematocrit 36.0 - 46.0 % 40.0  39.7  38.8   Platelets 150 - 400 K/uL 241  248  220       Latest Ref Rng & Units 03/04/2024   12:34 PM 02/23/2024    9:55 AM 02/20/2023    2:04 PM  CMP  Glucose 70 - 99 mg/dL 90  81  93   BUN 8 - 23 mg/dL 18  11  20    Creatinine 0.44 - 1.00 mg/dL 8.89  9.06  9.03   Sodium 135 - 145 mmol/L 140  140  136   Potassium 3.5 - 5.1 mmol/L 4.1  4.3  3.6   Chloride 98 - 111 mmol/L 105  101  105   CO2 22 - 32 mmol/L 23  24  24    Calcium 8.9 - 10.3 mg/dL 8.8  9.6  8.5   Total Protein 6.5 - 8.1 g/dL 6.8  6.8  6.7   Total Bilirubin 0.0 - 1.2 mg/dL 0.4  0.4  0.5   Alkaline Phos 38 - 126 U/L 52  78  59   AST 15 - 41 U/L 23  27  24    ALT 0 - 44 U/L 24  27  29      DIAGNOSTIC IMAGING:  I have independently reviewed the scans and discussed with the patient. No results found.   WRAP UP:  All questions were answered. The patient knows to call the clinic with any problems, questions or concerns.  Medical decision making: Moderate  Time spent on visit: I spent 20 minutes counseling the patient face to face. The total time spent in the appointment was 30 minutes and more than 50% was on  counseling.  Pleasant CHRISTELLA Barefoot, PA-C  03/04/24 10:52 PM

## 2024-03-04 ENCOUNTER — Inpatient Hospital Stay: Payer: Medicare Other | Attending: Physician Assistant | Admitting: Physician Assistant

## 2024-03-04 ENCOUNTER — Inpatient Hospital Stay

## 2024-03-04 ENCOUNTER — Other Ambulatory Visit (HOSPITAL_COMMUNITY): Payer: Self-pay | Admitting: Physician Assistant

## 2024-03-04 VITALS — BP 130/65 | HR 59 | Temp 98.6°F | Resp 18 | Wt 170.4 lb

## 2024-03-04 DIAGNOSIS — Z1231 Encounter for screening mammogram for malignant neoplasm of breast: Secondary | ICD-10-CM

## 2024-03-04 DIAGNOSIS — Z170 Estrogen receptor positive status [ER+]: Secondary | ICD-10-CM

## 2024-03-04 DIAGNOSIS — Z87891 Personal history of nicotine dependence: Secondary | ICD-10-CM | POA: Diagnosis not present

## 2024-03-04 DIAGNOSIS — Z17411 Hormone receptor positive with human epidermal growth factor receptor 2 negative status: Secondary | ICD-10-CM | POA: Diagnosis not present

## 2024-03-04 DIAGNOSIS — C50412 Malignant neoplasm of upper-outer quadrant of left female breast: Secondary | ICD-10-CM | POA: Insufficient documentation

## 2024-03-04 DIAGNOSIS — Z853 Personal history of malignant neoplasm of breast: Secondary | ICD-10-CM

## 2024-03-04 LAB — COMPREHENSIVE METABOLIC PANEL WITH GFR
ALT: 24 U/L (ref 0–44)
AST: 23 U/L (ref 15–41)
Albumin: 3.6 g/dL (ref 3.5–5.0)
Alkaline Phosphatase: 52 U/L (ref 38–126)
Anion gap: 12 (ref 5–15)
BUN: 18 mg/dL (ref 8–23)
CO2: 23 mmol/L (ref 22–32)
Calcium: 8.8 mg/dL — ABNORMAL LOW (ref 8.9–10.3)
Chloride: 105 mmol/L (ref 98–111)
Creatinine, Ser: 1.1 mg/dL — ABNORMAL HIGH (ref 0.44–1.00)
GFR, Estimated: 56 mL/min — ABNORMAL LOW (ref >60)
Glucose, Bld: 90 mg/dL (ref 70–99)
Potassium: 4.1 mmol/L (ref 3.5–5.1)
Sodium: 140 mmol/L (ref 135–145)
Total Bilirubin: 0.4 mg/dL (ref 0.0–1.2)
Total Protein: 6.8 g/dL (ref 6.5–8.1)

## 2024-03-04 LAB — CBC WITH DIFFERENTIAL/PLATELET
Abs Immature Granulocytes: 0.02 K/uL (ref 0.00–0.07)
Basophils Absolute: 0.1 K/uL (ref 0.0–0.1)
Basophils Relative: 1 %
Eosinophils Absolute: 0.3 K/uL (ref 0.0–0.5)
Eosinophils Relative: 5 %
HCT: 40 % (ref 36.0–46.0)
Hemoglobin: 13.4 g/dL (ref 12.0–15.0)
Immature Granulocytes: 0 %
Lymphocytes Relative: 37 %
Lymphs Abs: 2 K/uL (ref 0.7–4.0)
MCH: 31.7 pg (ref 26.0–34.0)
MCHC: 33.5 g/dL (ref 30.0–36.0)
MCV: 94.6 fL (ref 80.0–100.0)
Monocytes Absolute: 0.7 K/uL (ref 0.1–1.0)
Monocytes Relative: 13 %
Neutro Abs: 2.5 K/uL (ref 1.7–7.7)
Neutrophils Relative %: 44 %
Platelets: 241 K/uL (ref 150–400)
RBC: 4.23 MIL/uL (ref 3.87–5.11)
RDW: 13.2 % (ref 11.5–15.5)
WBC: 5.6 K/uL (ref 4.0–10.5)
nRBC: 0 % (ref 0.0–0.2)

## 2024-03-04 NOTE — Patient Instructions (Signed)
 [facility] **VISIT SUMMARY & IMPORTANT INSTRUCTIONS **   You were seen today by Pleasant Barefoot PA-C for your history of left-sided breast cancer.    LEFT BREAST CANCER We will check mammogram of both breasts as soon as possible, with additional ultrasound imaging of left breast due to new breast pain and swelling. As long as these tests are normal, we will plan on seeing you for follow-up in 1 year.  FOLLOW-UP APPOINTMENT: 1 year  ** Thank you for trusting me with your healthcare!  I strive to provide all of my patients with quality care at each visit.  If you receive a survey for this visit, I would be so grateful to you for taking the time to provide feedback.  Thank you in advance!  ~ Raissa Dam                                        Dr. Mickiel Davonna Pleasant Barefoot, PA-C          Delon Hope, NP   - - - - - - - - - - - - - - - - - -    Thank you for choosing [facility] at [facility] to provide your oncology and hematology care.  To afford each patient quality time with our provider, please arrive at least 15 minutes before your scheduled appointment time.   If you have a lab appointment with the Cancer Center please come in thru the Main Entrance and check in at the main information desk.  You need to re-schedule your appointment should you arrive 10 or more minutes late.  We strive to give you quality time with our providers, and arriving late affects you and other patients whose appointments are after yours.  Also, if you no show three or more times for appointments you may be dismissed from the clinic at the providers discretion.     Again, thank you for choosing [facility].  Our hope is that these requests will decrease the amount of time that you wait before being seen by our physicians.       _____________________________________________________________  Should you have questions after  your visit to [facility], please contact our office at [phone] and follow the prompts.  Our office hours are 8:00 a.m. and 4:30 p.m. Monday - Friday.  Please note that voicemails left after 4:00 p.m. may not be returned until the following business day.  We are closed weekends and major holidays.  You do have access to a nurse 24-7, just call the main number to the clinic [phone] and do not press any options, hold on the line and a nurse will answer the phone.    For prescription refill requests, have your pharmacy contact our office and allow 72 hours.

## 2024-03-08 ENCOUNTER — Ambulatory Visit: Payer: Self-pay | Admitting: Cardiology

## 2024-03-08 ENCOUNTER — Encounter: Payer: Self-pay | Admitting: Cardiology

## 2024-03-12 ENCOUNTER — Ambulatory Visit (HOSPITAL_COMMUNITY)
Admission: RE | Admit: 2024-03-12 | Discharge: 2024-03-12 | Disposition: A | Source: Ambulatory Visit | Attending: Physician Assistant

## 2024-03-12 ENCOUNTER — Ambulatory Visit (HOSPITAL_COMMUNITY)
Admission: RE | Admit: 2024-03-12 | Discharge: 2024-03-12 | Disposition: A | Discharge status: Home / Self Care | Source: Ambulatory Visit | Attending: Physician Assistant | Admitting: Physician Assistant

## 2024-03-12 ENCOUNTER — Other Ambulatory Visit (HOSPITAL_COMMUNITY): Payer: Self-pay | Admitting: Physician Assistant

## 2024-03-12 ENCOUNTER — Encounter (HOSPITAL_COMMUNITY): Payer: Self-pay

## 2024-03-12 DIAGNOSIS — C50412 Malignant neoplasm of upper-outer quadrant of left female breast: Secondary | ICD-10-CM | POA: Diagnosis present

## 2024-03-12 DIAGNOSIS — Z853 Personal history of malignant neoplasm of breast: Secondary | ICD-10-CM

## 2024-03-12 DIAGNOSIS — Z170 Estrogen receptor positive status [ER+]: Secondary | ICD-10-CM | POA: Diagnosis present

## 2024-03-12 DIAGNOSIS — R928 Other abnormal and inconclusive findings on diagnostic imaging of breast: Secondary | ICD-10-CM

## 2024-03-13 NOTE — Progress Notes (Signed)
 Last read by [name] at 4:33PM on 03/13/2024.

## 2024-03-14 ENCOUNTER — Encounter (HOSPITAL_COMMUNITY): Payer: Self-pay

## 2024-03-14 ENCOUNTER — Other Ambulatory Visit (HOSPITAL_COMMUNITY): Payer: Self-pay | Admitting: Physician Assistant

## 2024-03-14 ENCOUNTER — Ambulatory Visit (HOSPITAL_COMMUNITY)
Admission: RE | Admit: 2024-03-14 | Discharge: 2024-03-14 | Disposition: A | Discharge status: Home / Self Care | Source: Ambulatory Visit | Attending: Physician Assistant | Admitting: Physician Assistant

## 2024-03-14 DIAGNOSIS — R928 Other abnormal and inconclusive findings on diagnostic imaging of breast: Secondary | ICD-10-CM | POA: Insufficient documentation

## 2024-03-14 DIAGNOSIS — N6320 Unspecified lump in the left breast, unspecified quadrant: Secondary | ICD-10-CM | POA: Diagnosis present

## 2024-03-14 DIAGNOSIS — N6032 Fibrosclerosis of left breast: Secondary | ICD-10-CM | POA: Diagnosis not present

## 2024-03-14 HISTORY — PX: BREAST BIOPSY: SHX20

## 2024-03-14 MED ORDER — LIDOCAINE-EPINEPHRINE (PF) 1 %-1:200000 IJ SOLN
10.0000 mL | Freq: Once | INTRAMUSCULAR | Status: AC
Start: 2024-03-14 — End: 2024-03-14
  Administered 2024-03-14: 10 mL via INTRADERMAL

## 2024-03-14 MED ORDER — LIDOCAINE HCL (PF) 2 % IJ SOLN
10.0000 mL | Freq: Once | INTRAMUSCULAR | Status: AC
Start: 2024-03-14 — End: 2024-03-14
  Administered 2024-03-14: 10 mL

## 2024-03-14 MED ORDER — LIDOCAINE HCL (PF) 2 % IJ SOLN
INTRAMUSCULAR | Status: AC
  Filled 2024-03-14: qty 10

## 2024-03-15 LAB — SURGICAL PATHOLOGY

## 2024-03-18 LAB — CYTOLOGY - NON PAP

## 2024-03-20 ENCOUNTER — Ambulatory Visit: Payer: Self-pay | Admitting: Physician Assistant

## 2024-03-20 DIAGNOSIS — Z1231 Encounter for screening mammogram for malignant neoplasm of breast: Secondary | ICD-10-CM

## 2024-03-20 DIAGNOSIS — C50412 Malignant neoplasm of upper-outer quadrant of left female breast: Secondary | ICD-10-CM

## 2024-03-20 NOTE — Progress Notes (Signed)
 I have reviewed results of diagnostic mammogram, as well as biopsy results that were taken on 03/14/2024.  Findings were benign, which has already been communicated to patient by radiology department.  Next bilateral screening mammogram will be due September 2026. Patient should see me in October 2026 with labs done the week before to include CBC/D and CMP.  Pleasant CHRISTELLA Barefoot, PA-C 03/20/24 4:14 PM

## 2024-06-03 ENCOUNTER — Telehealth: Payer: Self-pay | Admitting: Cardiology

## 2024-06-03 DIAGNOSIS — E785 Hyperlipidemia, unspecified: Secondary | ICD-10-CM

## 2024-06-03 DIAGNOSIS — R079 Chest pain, unspecified: Secondary | ICD-10-CM

## 2024-06-03 NOTE — Telephone Encounter (Signed)
 Patient calling in regards to wanting coronary CTA. Had previously been ordered (per patient), but no orders seen or it already expired. No other complaints at this time. Pt scheduled appt with agent. Will send to Dr. Sheena for clarification of test. Will place order accordingly once confirmed, as patient wishes to get this doctor before January appt. Pt verbalizes understanding of plan.

## 2024-06-03 NOTE — Telephone Encounter (Signed)
 Pt calling to schedule f/u appt with Tobb. She also was asking about a CT she thought Tobb wanted her to have. I did not see an order. Please advise.

## 2024-06-07 NOTE — Telephone Encounter (Signed)
 Orders have been placed.

## 2024-06-07 NOTE — Addendum Note (Signed)
 Addended by: CHAUVIGNE, Anberlyn Feimster on: 06/07/2024 07:22 AM   Modules accepted: Orders

## 2024-06-19 ENCOUNTER — Ambulatory Visit (HOSPITAL_COMMUNITY)
Admission: RE | Admit: 2024-06-19 | Discharge: 2024-06-19 | Disposition: A | Discharge status: Home / Self Care | Source: Ambulatory Visit | Attending: Cardiology | Admitting: Cardiology

## 2024-06-19 DIAGNOSIS — Z136 Encounter for screening for cardiovascular disorders: Secondary | ICD-10-CM | POA: Insufficient documentation

## 2024-06-19 DIAGNOSIS — R079 Chest pain, unspecified: Secondary | ICD-10-CM | POA: Insufficient documentation

## 2024-06-19 DIAGNOSIS — I2510 Atherosclerotic heart disease of native coronary artery without angina pectoris: Secondary | ICD-10-CM | POA: Insufficient documentation

## 2024-06-19 DIAGNOSIS — E785 Hyperlipidemia, unspecified: Secondary | ICD-10-CM | POA: Insufficient documentation

## 2024-06-19 MED ORDER — NITROGLYCERIN 0.4 MG SL SUBL
0.8000 mg | SUBLINGUAL_TABLET | Freq: Once | SUBLINGUAL | Status: AC
  Administered 2024-06-19: 0.8 mg via SUBLINGUAL

## 2024-06-19 MED ORDER — IOHEXOL 350 MG/ML SOLN
100.0000 mL | Freq: Once | INTRAVENOUS | Status: AC | PRN
  Administered 2024-06-19: 100 mL via INTRAVENOUS

## 2024-06-19 MED ORDER — DILTIAZEM HCL 25 MG/5ML IV SOLN: 10.0000 mg | INTRAVENOUS | Status: DC | PRN

## 2024-06-19 MED ORDER — METOPROLOL TARTRATE 5 MG/5ML IV SOLN
10.0000 mg | INTRAVENOUS | Status: DC | PRN
  Administered 2024-06-19: 5 mg via INTRAVENOUS

## 2024-07-10 ENCOUNTER — Ambulatory Visit: Attending: Cardiovascular Disease | Admitting: Cardiology

## 2024-07-10 VITALS — BP 134/83 | HR 77 | Ht 65.0 in | Wt 172.6 lb

## 2024-07-10 DIAGNOSIS — I2510 Atherosclerotic heart disease of native coronary artery without angina pectoris: Secondary | ICD-10-CM

## 2024-07-10 DIAGNOSIS — I10 Essential (primary) hypertension: Secondary | ICD-10-CM

## 2024-07-10 DIAGNOSIS — E782 Mixed hyperlipidemia: Secondary | ICD-10-CM | POA: Diagnosis not present

## 2024-07-10 MED ORDER — ATORVASTATIN CALCIUM 10 MG PO TABS: 10.0000 mg | ORAL_TABLET | Freq: Every day | ORAL | 3 refills | Status: AC

## 2024-07-10 NOTE — Patient Instructions (Addendum)
 Medication Instructions:  Your physician has recommended you make the following change in your medication:  START: :Lipitor 10 mg once daily *If you need a refill on your cardiac medications before your next appointment, please call your pharmacy*  Lab Work: In 12 weeks: Lipids, Lp(a) (Week of April 20th-24th)  If you have labs (blood work) drawn today and your tests are completely normal, you will receive your results only by: MyChart Message (if you have MyChart) OR A paper copy in the mail If you have any lab test that is abnormal or we need to change your treatment, we will call you to review the results.  Follow-Up: At [facility], you and your health needs are our priority.  As part of our continuing mission to provide you with exceptional heart care, our providers are all part of one team.  This team includes your primary Cardiologist (physician) and Advanced Practice Providers or APPs (Physician Assistants and Nurse Practitioners) who all work together to provide you with the care you need, when you need it.  Your next appointment:   1 year(s) contact the office in  August to get the appointment set up   Provider:   Kardie Tobb, DO    Other Instructions

## 2024-07-10 NOTE — Progress Notes (Signed)
 " Cardiology Office Note:    Date:  07/13/2024   ID:  Haley Santana, DOB 10-Mar-1961, MRN [id number]  PCP:  Jackolyn Darice BROCKS, FNP  Cardiologist:  Dub Huntsman, DO  Electrophysiologist:  None   Referring MD: Jackolyn Darice BROCKS, FNP    I am doing well  History of Present Illness:    Haley Santana is a 64 y.o. female with a hx of breast cancer, GERD and arthiritis.   At her last visit she was experiencing chest discomfort.  Given her risk factors and patient for coronary CTA which came back with evidence of mild coronary artery disease.  She is no longer experiencing an chest discomfort.  She found that most of her sensation were GI driven.  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/20/2017   left invasive ductal carcinoma   Cancer (HCC)    Diverticulitis    GERD (gastroesophageal reflux disease)    septic ulcers   H/O benign breast biopsy 1984   right   Hemorrhoids    Personal history of radiation therapy 2018    Past Surgical History:  Procedure Laterality Date   BIOPSY  10/16/2020   Procedure: BIOPSY;  Surgeon: Cindie Carlin POUR, DO;  Location: [facility];  Service: Endoscopy;;   BREAST BIOPSY Left 03/14/2024   US  LT BREAST BX W LOC DEV 1ST LESION IMG BX SPEC US  GUIDE 03/14/2024 [facility]   BREAST LUMPECTOMY Left 2018   invasive ductal carcinoma   CHOLECYSTECTOMY     COLONOSCOPY WITH PROPOFOL  N/A 10/16/2020   Procedure: COLONOSCOPY WITH PROPOFOL ;  Surgeon: Cindie Carlin POUR, DO;  Location: [facility];  Service: Endoscopy;  Laterality: N/A;  PM   CYST EXCISION     DILATATION & CURETTAGE/HYSTEROSCOPY WITH MYOSURE N/A 04/04/2022   Procedure: DILATATION & CURETTAGE/HYSTEROSCOPY WITH MYOSURE;  Surgeon: Jannis Kate Norris, MD;  Location: [facility];  Service: Gynecology;  Laterality: N/A;   ESOPHAGOGASTRODUODENOSCOPY (EGD) WITH PROPOFOL  N/A 10/16/2020   Procedure: ESOPHAGOGASTRODUODENOSCOPY (EGD) WITH PROPOFOL ;   Surgeon: Cindie Carlin POUR, DO;  Location: [facility];  Service: Endoscopy;  Laterality: N/A;   HERNIA REPAIR      Current Medications: Active Medications[1]   Allergies:   Banana, Dust mite extract, Fluoride preparations, Justicia adhatoda, Other, Shellfish allergy, Latex, and Sulfa  antibiotics   Social History   Socioeconomic History   Marital status: Single    Spouse name: Not on file   Number of children: Not on file   Years of education: Not on file   Highest education level: Not on file  Occupational History   Not on file  Tobacco Use   Smoking status: Former    Passive exposure: Past   Smokeless tobacco: Never  Vaping Use   Vaping status: Never Used  Substance and Sexual Activity   Alcohol  use: Yes    Comment: 1-2 times per year   Drug use: Never   Sexual activity: Not Currently    Partners: Male    Birth control/protection: Post-menopausal  Other Topics Concern   Not on file  Social History Narrative   Not on file   Social Drivers of Health   Tobacco Use: Medium Risk (05/23/2024)   Received from [facility]   Patient History    Smoking Tobacco Use: Former    Smokeless Tobacco Use: Never    Passive Exposure: Not on Actuary Strain: Not on file  Food Insecurity:  Low Risk (05/23/2024)   Received from [facility]   Epic    Within the past 12 months, you worried that your food would run out before you got money to buy more: Never true    Within the past 12 months, the food you bought just didn't last and you didn't have money to get more. : Never true  Transportation Needs: No Transportation Needs (05/23/2024)   Received from [facility]    In the past 12 months, has lack of reliable transportation kept you from medical appointments, meetings, work or from getting things needed for daily living? : No  Physical Activity: Not on file  Stress: Not on file  Social Connections: Not on file  Depression (PHQ2-9): Low Risk  (03/04/2024)   Depression (PHQ2-9)    PHQ-2 Score: 1  Alcohol  Screen: Not on file  Housing: Low Risk (05/23/2024)   Received from [facility]   Epic    What is your living situation today?: I have a steady place to live    Think about the place you live. Do you have problems with any of the following? Choose all that apply:: None/None on this list  Utilities: Low Risk (05/23/2024)   Received from [facility]   Utilities    In the past 12 months has the electric, gas, oil, or water  company threatened to shut off services in your home? : No  Health Literacy: Not on file     Family History: The patient's family history includes Breast cancer in her maternal aunt; Cancer in her brother, father, maternal aunt, mother, and sister; Diabetes in her brother; Hypertension in her mother.  ROS:   Review of Systems  Constitution: Negative for decreased appetite, fever and weight gain.  HENT: Negative for congestion, ear discharge, hoarse voice and sore throat.   Eyes: Negative for discharge, redness, vision loss in right eye and visual halos.  Cardiovascular: Negative for chest pain, dyspnea on exertion, leg swelling, orthopnea and palpitations.  Respiratory: Negative for cough, hemoptysis, shortness of breath and snoring.   Endocrine: Negative for heat intolerance and polyphagia.  Hematologic/Lymphatic: Negative for bleeding problem. Does not bruise/bleed easily.  Skin: Negative for flushing, nail changes, rash and suspicious lesions.  Musculoskeletal: Negative for arthritis, joint pain, muscle cramps, myalgias, neck pain and stiffness.  Gastrointestinal: Negative for abdominal pain, bowel incontinence, diarrhea and excessive appetite.  Genitourinary: Negative for decreased libido, genital sores and incomplete emptying.  Neurological: Negative for brief paralysis, focal weakness, headaches and loss of balance.  Psychiatric/Behavioral: Negative for altered mental status, depression and  suicidal ideas.  Allergic/Immunologic: Negative for HIV exposure and persistent infections.    EKGs/Labs/Other Studies Reviewed:    The following studies were reviewed today:   EKG:  The ekg ordered today demonstrates   Recent Labs: 02/23/2024: Magnesium 2.0 03/04/2024: ALT 24; BUN 18; Creatinine, Ser 1.10; Hemoglobin 13.4; Platelets 241; Potassium 4.1; Sodium 140  Recent Lipid Panel    Component Value Date/Time   CHOL 215 (H) 02/23/2024 0955   TRIG 94 02/23/2024 0955   HDL 46 02/23/2024 0955   CHOLHDL 4.7 (H) 02/23/2024 0955   LDLCALC 152 (H) 02/23/2024 0955    Physical Exam:    VS:  BP 134/83 (BP Location: Left Arm, Patient Position: Sitting)   Pulse 77   Ht 5' 5 (1.651 m)   Wt 172 lb 9.6 oz (78.3 kg)   SpO2 97%   BMI 28.72 kg/m     Wt Readings  from Last 3 Encounters:  07/10/24 172 lb 9.6 oz (78.3 kg)  03/04/24 170 lb 6.7 oz (77.3 kg)  02/23/24 171 lb 3.2 oz (77.7 kg)     GEN: Well nourished, well developed in no acute distress HEENT: Normal NECK: No JVD; No carotid bruits LYMPHATICS: No lymphadenopathy CARDIAC: S1S2 noted,RRR, no murmurs, rubs, gallops RESPIRATORY:  Clear to auscultation without rales, wheezing or rhonchi  ABDOMEN: Soft, non-tender, non-distended, +bowel sounds, no guarding. EXTREMITIES: No edema, No cyanosis, no clubbing MUSCULOSKELETAL:  No deformity  SKIN: Warm and dry NEUROLOGIC:  Alert and oriented x 3, non-focal PSYCHIATRIC:  Normal affect, good insight  ASSESSMENT:    1. Mixed hyperlipidemia   2. Minimal CAD   3. Primary hypertension    PLAN:     Minimal coronary artery disease-we talked about what this means.  I shared with the patient that we need to pursue primary prevention as her LDL is 152 we need to work and get that less than 70.  She is agreeable to start Lipitor 10 mg daily.  She will repeat blood work in 6 weeks.  Blood pressure is at target. Will continue current dose of metoprolol   The patient is in agreement  with the above plan. The patient left the office in stable condition.  The patient will follow up in   Medication Adjustments/Labs and Tests Ordered: Current medicines are reviewed at length with the patient today.  Concerns regarding medicines are outlined above.  Orders Placed This Encounter  Procedures   Lipid panel   Lipoprotein A (LPA)   Meds ordered this encounter  Medications   atorvastatin  (LIPITOR) 10 MG tablet    Sig: Take 1 tablet (10 mg total) by mouth daily.    Dispense:  90 tablet    Refill:  3    Patient Instructions  Medication Instructions:  Your physician has recommended you make the following change in your medication:  START: :Lipitor 10 mg once daily *If you need a refill on your cardiac medications before your next appointment, please call your pharmacy*  Lab Work: In 12 weeks: Lipids, Lp(a) (Week of April 20th-24th)  If you have labs (blood work) drawn today and your tests are completely normal, you will receive your results only by: MyChart Message (if you have MyChart) OR A paper copy in the mail If you have any lab test that is abnormal or we need to change your treatment, we will call you to review the results.  Follow-Up: At [facility], you and your health needs are our priority.  As part of our continuing mission to provide you with exceptional heart care, our providers are all part of one team.  This team includes your primary Cardiologist (physician) and Advanced Practice Providers or APPs (Physician Assistants and Nurse Practitioners) who all work together to provide you with the care you need, when you need it.  Your next appointment:   1 year(s) contact the office in  August to get the appointment set up   Provider:   Taeya Theall, DO    Other Instructions            Adopting a Healthy Lifestyle.  Know what a healthy weight is for you (roughly BMI <25) and aim to maintain this   Aim for 7+ servings of fruits and vegetables  daily   65-80+ fluid ounces of water  or unsweet tea for healthy kidneys   Limit to max 1 drink of alcohol  per day; avoid smoking/tobacco  Limit animal fats in diet for cholesterol and heart health - choose grass fed whenever available   Avoid highly processed foods, and foods high in saturated/trans fats   Aim for low stress - take time to unwind and care for your mental health   Aim for 150 min of moderate intensity exercise weekly for heart health, and weights twice weekly for bone health   Aim for 7-9 hours of sleep daily   When it comes to diets, agreement about the perfect plan isnt easy to find, even among the experts. Experts at the [facility] of [employer] developed an idea known as the Healthy Eating Plate. Just imagine a plate divided into logical, healthy portions.   The emphasis is on diet quality:   Load up on vegetables and fruits - one-half of your plate: Aim for color and variety, and remember that potatoes dont count.   Go for whole grains - one-quarter of your plate: Whole wheat, barley, wheat berries, quinoa, oats, brown rice, and foods made with them. If you want pasta, go with whole wheat pasta.   Protein power - one-quarter of your plate: Fish, chicken, beans, and nuts are all healthy, versatile protein sources. Limit red meat.   The diet, however, does go beyond the plate, offering a few other suggestions.   Use healthy plant oils, such as olive, canola, soy, corn, sunflower and peanut. Check the labels, and avoid partially hydrogenated oil, which have unhealthy trans fats.   If youre thirsty, drink water . Coffee and tea are good in moderation, but skip sugary drinks and limit milk and dairy products to one or two daily servings.   The type of carbohydrate in the diet is more important than the amount. Some sources of carbohydrates, such as vegetables, fruits, whole grains, and beans-are healthier than others.   Finally, stay  active  Signed, Aldrin Engelhard, DO  07/13/2024 3:00 PM    [facility]     [1]  Current Meds  Medication Sig   atorvastatin  (LIPITOR) 10 MG tablet Take 1 tablet (10 mg total) by mouth daily.   omeprazole  (PRILOSEC) 20 MG capsule Take 1 capsule (20 mg total) by mouth 2 (two) times daily before a meal.   oxyCODONE -acetaminophen  (PERCOCET) 10-325 MG tablet Take 1 tablet by mouth 3 (three) times daily as needed.   "

## 2024-07-12 ENCOUNTER — Ambulatory Visit: Payer: Self-pay | Admitting: Cardiology

## 2024-09-02 ENCOUNTER — Ambulatory Visit: Admitting: Physician Assistant

## 2024-09-02 ENCOUNTER — Other Ambulatory Visit

## 2025-03-24 ENCOUNTER — Other Ambulatory Visit

## 2025-03-24 ENCOUNTER — Ambulatory Visit (HOSPITAL_COMMUNITY)

## 2025-03-26 ENCOUNTER — Other Ambulatory Visit

## 2025-04-02 ENCOUNTER — Ambulatory Visit: Admitting: Physician Assistant
